# Patient Record
Sex: Male | Born: 1935 | Race: White | Hispanic: No | Marital: Married | Smoking: Never smoker
Health system: Southern US, Community
[De-identification: ages and names within clinical notes are randomized; demographics above are authoritative.]

## PROBLEM LIST (undated history)

## (undated) DIAGNOSIS — N200 Calculus of kidney: Secondary | ICD-10-CM

## (undated) DIAGNOSIS — G4730 Sleep apnea, unspecified: Secondary | ICD-10-CM

## (undated) DIAGNOSIS — I2510 Atherosclerotic heart disease of native coronary artery without angina pectoris: Secondary | ICD-10-CM

## (undated) DIAGNOSIS — Z87442 Personal history of urinary calculi: Secondary | ICD-10-CM

## (undated) DIAGNOSIS — C801 Malignant (primary) neoplasm, unspecified: Secondary | ICD-10-CM

## (undated) DIAGNOSIS — E785 Hyperlipidemia, unspecified: Secondary | ICD-10-CM

## (undated) DIAGNOSIS — R0602 Shortness of breath: Secondary | ICD-10-CM

## (undated) DIAGNOSIS — J189 Pneumonia, unspecified organism: Secondary | ICD-10-CM

## (undated) DIAGNOSIS — M1990 Unspecified osteoarthritis, unspecified site: Secondary | ICD-10-CM

## (undated) DIAGNOSIS — R51 Headache: Secondary | ICD-10-CM

## (undated) DIAGNOSIS — K219 Gastro-esophageal reflux disease without esophagitis: Secondary | ICD-10-CM

## (undated) DIAGNOSIS — I499 Cardiac arrhythmia, unspecified: Secondary | ICD-10-CM

## (undated) DIAGNOSIS — I10 Essential (primary) hypertension: Secondary | ICD-10-CM

## (undated) HISTORY — PX: EYE SURGERY: SHX253

## (undated) HISTORY — PX: CARDIAC CATHETERIZATION: SHX172

## (undated) HISTORY — PX: HERNIA REPAIR: SHX51

---

## 2009-02-06 HISTORY — PX: CORONARY ANGIOPLASTY: SHX604

## 2012-07-26 ENCOUNTER — Encounter (HOSPITAL_COMMUNITY): Payer: Self-pay | Admitting: *Deleted

## 2012-07-26 ENCOUNTER — Other Ambulatory Visit: Payer: Self-pay | Admitting: Urology

## 2012-07-26 ENCOUNTER — Encounter (HOSPITAL_COMMUNITY): Payer: Self-pay | Admitting: Pharmacy Technician

## 2012-07-26 NOTE — H&P (Signed)
History of Present Illness  Nicolas Bush is a 78 year old with the following urologic history:  1) Prostate cancer: He was initially diagnosed in January 2009 and elected to be managed with active surveillance.  Initial diagnosis: February 28, 2007 TNM stage: cT1c Nx Mx Gleason score: 3+3=6 PSA at diagnosis: 18.10 Feb 2007: Initial biopsy- 1/14 cores involved -- 1 core on left side , < 1% of overall tissue, volume 129 cc May 2009: Biopsy - 22 cores, no malignancy, volume 171.7 cc June 2010: Biopsy - 20 cores, no malignancy, volume 215.29 cc Oct 2012: Biopsy - 12 cores, no malignancy, HGPIN, volume 221.0 cc  2) BPH/LUTS: His prostate volume has been estimated to be > 200 cc. Baseline symptoms include frequency, urgency, urge incontinence, incomplete emptying, intermittency, and a weak stream.  Baseline IPSS: 18/3 Current treatment: Finasteride (began 5 alpha reductase therapy in 2008), Doxazosin 4 mg  3) Erectile dysfunction: He does have significant erectile dysfunction.  This is not of concern to the patient.  4) Nephrolithiasis:  5) Hematuria: He was noted to have persistent microscopic hematuria in May 2014. He has denied gross hematuria.  He has no history of smoking. There is no family history of bladder or kidney cancer. He was found to have renal calculi as the most likely cause for his hematuria. May 2014: Cystoscopy (negative except for BPH), CT imaging (renal calculi)  ** Medical comorbidities: He had 3 cardiac stents placed in 2011 and is managed with aspirin.   Interval history:  He follows up today with a complaint of right-sided flank pain with radiation into his right lower quadrant.  His pain has been intermittently severe although currently is tolerable.  He has taken some intermittent narcotic pain medication as prescribed by his primary care physician.  He has not noted any gross hematuria nor fever.  He has had nausea and vomiting.     Past Medical History Problems   1. History of  Arthritis V13.4 2. History of  Cath Stent 1 Type Drug-Eluting 3. History of  Coronary Artery Disease V12.59 4. History of  Esophageal Reflux 530.81 5. History of  Hypothyroidism 244.9 6. Nephrolithiasis 592.0 7. Prostate Cancer 185  Surgical History Problems  1. History of  Inguinal Hernia Repair Bilateral  Current Meds 1. AmLODIPine Besylate 5 MG Oral Tablet; Therapy: 21Nov2012 to 2. CVS Aspirin Adult Low Strength 81 MG Oral Tablet Delayed Release; Therapy:  (Recorded:24Jul2012) to 3. Doxazosin Mesylate 4 MG Oral Tablet; Take 1 tablet daily; Therapy: 27May2009 to  (Evaluate:05Sep2015)  Requested for: 13Jun2014; Last Rx:12Jun2014 4. Finasteride 5 MG Oral Tablet; TAKE 1 TABLET EVERY DAY AS DIRECTED; Therapy: 11Jan2010 to  (Evaluate:05Sep2015)  Requested for: 13Jun2014; Last Rx:12Jun2014 5. Isosorbide Mononitrate ER 30 MG Oral Tablet Extended Release 24 Hour; Therapy: 17Apr2013 to 6. Metoprolol Tartrate 50 MG Oral Tablet; Therapy: 15Jan2014 to 7. Nitrostat 0.4 MG Sublingual Tablet Sublingual; Therapy: 17Apr2013 to 8. Polymyxin B-Trimethoprim 10000-0.1 UNIT/ML-% Ophthalmic Solution; Therapy: 07May2014 to 9. PrednisoLONE Acetate 1 % Ophthalmic Suspension; Therapy: 07May2014 to 10. Ranitidine HCl 300 MG Oral Tablet; Therapy: 17Apr2013 to  Allergies Medication  1. No Known Drug Allergies  Family History Problems  1. Sororal history of  Colon Cancer V16.0 2. Paternal history of  Heart Disease V17.49 3. Maternal history of  Lung Cancer V16.1 4. Fraternal history of  Nephrolithiasis 5. Daughter's history of  Ovarian Cancer V16.41 6. Fraternal history of  Prostate Cancer V16.42 7. Fraternal history of  Prostate Cancer V16.42 8. Fraternal  history of  Prostate Cancer V16.42 9. Fraternal history of  Tuberculosis  Social History Problems  1. Marital History - Currently Married 2. Never A Smoker Denied  3. Alcohol Use 4. Tobacco Use  Review of  Systems  Genitourinary: no hematuria.  Gastrointestinal: nausea and vomiting.  Constitutional: no fever.    Vitals Vital Signs [Data Includes: Last 1 Day]  20Jun2014 08:40AM  BMI Calculated: 27.77 BSA Calculated: 2.15 Height: 6 ft  Weight: 205 lb  Blood Pressure: 121 / 74 Heart Rate: 76  Physical Exam Constitutional: Well nourished and well developed . No acute distress.  ENT:. The ears and nose are normal in appearance.  Neck: The appearance of the neck is normal and no neck mass is present.  Pulmonary: No respiratory distress, normal respiratory rhythm and effort and clear bilateral breath sounds.  Cardiovascular: Heart rate and rhythm are normal . No peripheral edema.  Abdomen: The abdomen is soft and nontender. No masses are palpated. mild right CVA tenderness. No hernias are palpable. No hepatosplenomegaly noted.  Skin: Normal skin turgor, no visible rash and no visible skin lesions.  Neuro/Psych:. Mood and affect are appropriate.    Results/Data Urine [Data Includes: Last 1 Day]   20Jun2014  COLOR YELLOW   APPEARANCE CLEAR   SPECIFIC GRAVITY 1.025   pH 6.0   GLUCOSE NEG mg/dL  BILIRUBIN NEG   KETONE NEG mg/dL  BLOOD TRACE   PROTEIN 30 mg/dL  UROBILINOGEN 1 mg/dL  NITRITE NEG   LEUKOCYTE ESTERASE NEG   SQUAMOUS EPITHELIAL/HPF NONE SEEN   WBC 0-2 WBC/hpf  RBC 0-2 RBC/hpf  BACTERIA RARE   CRYSTALS NONE SEEN   CASTS NONE SEEN   Selected Results  BASIC METABOLIC PANEL 20Jun2014 09:43AM Heloise Purpura  SPECIMEN TYPE: BLOOD   Test Name Result Flag Reference  GLUCOSE 120 mg/dL H 78-29  BUN 18 mg/dL  5-62  CREATININE 1.30 mg/dL H 8.65-7.84  SODIUM 696 mEq/L  135-145  POTASSIUM 4.2 mEq/L  3.5-5.3  CHLORIDE 103 mEq/L  96-112  CO2 24 mEq/L  19-32  CALCIUM 8.7 mg/dL  2.9-52.8  Est GFR, African American 51 mL/min L   Est GFR, NonAfrican American 45 mL/min L   THE ESTIMATED GFR IS A CALCULATION VALID FOR ADULTS (>=90 YEARS OLD) THAT USES THE CKD-EPI ALGORITHM TO  ADJUST FOR AGE AND SEX. IT IS   NOT TO BE USED FOR CHILDREN, PREGNANT WOMEN, HOSPITALIZED PATIENTS,    PATIENTS ON DIALYSIS, OR WITH RAPIDLY CHANGING KIDNEY FUNCTION. ACCORDING TO THE NKDEP, EGFR >89 IS NORMAL, 60-89 SHOWS MILD IMPAIRMENT, 30-59 SHOWS MODERATE IMPAIRMENT, 15-29 SHOWS SEVERE IMPAIRMENT AND <15 IS ESRD.   AU CT-HEMATURIA PROTOCOL 20May2014 12:00AM Heloise Purpura   Test Name Result Flag Reference  ** RADIOLOGY REPORT BY [facility] **   *RADIOLOGY REPORT*  Clinical Data: Microhematuria, prostate cancer diagnosed 2009, BPH, history of renal stones  CT ABDOMEN AND PELVIS WITHOUT AND WITH CONTRAST  Technique: Multidetector CT imaging of the abdomen and pelvis was performed without contrast material in one or both body regions, followed by contrast material(s) and further sections in one or both body regions.  Contrast: 125 ml Isovue 300 IV  Comparison: None.  Findings: Motion degraded images.  Lung bases are essentially clear.  Small hiatal hernia.  Liver, spleen, pancreas, and adrenal glands are within normal limits.  Multiple layering gallstones (series 2/image 30). No associated inflammatory changes.  14 x 12 mm left posterior interpolar cyst (series 6/image 47). No enhancing renal lesions.  Two nonobstructing calculi in the right upper pole measuring up to 6 mm (series 2/image 38). 7 x 3 mm nonobstructing calculus in the right lower pole (series 2/image 43). Cluster of nonobstructing calculi in the right renal pelvis, measuring 10 x 13 mm in aggregate (series 2/image 41). Largest individual pelvic calculus measures 9 mm (series 2/image 40). No hydronephrosis.  No evidence of bowel obstruction. Small distal duodenal diverticulum (series 2/image 33). Normal appendix. Extensive colonic diverticulosis, without associated inflammatory changes.  Atherosclerotic calcifications of the abdominal aorta and branch vessels.  No abdominopelvic  ascites.  Tiny retroperitoneal/pelvic lymph nodes measuring 5-6 mm short axis, which does not meet pathologic CT size criteria. Prostatomegaly, measuring 8.1 cm in transverse dimension, with enlargement of the central gland which indents the base of the bladder.  No distal ureteral or bladder calculi. Bladder is thick-walled, likely reflecting chronic outlet obstruction.  On delayed imaging, there are no filling defects in the bilateral opacified proximal collecting systems, ureters, or bladder.  Mild degenerative changes of the visualized thoracolumbar spine.  IMPRESSION: Multiple nonobstructing right renal calculi with additional calculi in the right proximal collecting system, as described above. No distal ureteral or bladder calculi. No hydronephrosis.  14 mm posterior interpolar left renal cyst. No enhancing renal lesions.  Prostatomegaly, measuring 8.1 cm in transverse dimension, with enlargement of the central gland.  No evidence metastatic disease in the abdomen/pelvis. Small retroperitoneal/pelvic lymph nodes measuring 5-6 mm short axis, which does not meet pathologic CT size criteria.  Thick-walled bladder, likely reflecting sequela of chronic bladder outlet obstruction.   Original Report Authenticated By: Charline Bills, M.D.     I again and apparently reviewed his CT scan from May today which demonstrated multiple renal calculi including a cluster of stones at the ureteropelvic junction.  His KUB x-ray today confirms the stones and there is the possibility of a distal ureteral stone on the right measuring approximately 5-6 mm.  Assessment Assessed  1. Nephrolithiasis 592.0  Plan Health Maintenance (V70.0)  1. UA With REFLEX  Done: 20Jun2014 08:34AM Nephrolithiasis (592.0)  2. VENIPUNCTURE  Done: 20Jun2014 3. Follow-up Schedule Surgery Office  Follow-up  Done: 20Jun2014  Discussion/Summary  1.  Right flank pain secondary to urolithiasis: I did recommend  proceeding with intervention at this point.  He was provided a prescription for pain medication and antinausea medication.  We discussed options which would include percutaneous nephrolithotomy versus shockwave lithotripsy versus ureteroscopic laser lithotripsy.  Considering his history of a cardiac stent and the need to remain on antiplatelet therapy, we discussed proceeding with ureteroscopic laser lithotripsy so that he may remain on his aspirin perioperatively.  He currently takes aspirin 81 mg.  He is no longer taking Plavix.  He is agreeable to this approach and we reviewed the potential risks, complications, and alternative options as well as the expected recovery process associated with this procedure.  He agrees to proceed and gives his informed consent.  2.  Prostate cancer: He will keep his scheduled appointment for a PSA and DRE in a few months.  3.  BPH/LUTS: He will continue current therapy with finasteride and doxazosin and will have a PVR/IPSS questionnaire performed in the next few months.  Cc: Dr. Lorelei Pont     Signatures Electronically signed by : Heloise Purpura, M.D.; Jul 26 2012  6:55PM

## 2012-07-26 NOTE — Progress Notes (Signed)
Patient to have second cataract surgery on right eye on 08/13/12.

## 2012-07-26 NOTE — Progress Notes (Signed)
Need orders in EPIC.  Surgery scheduled for 07/29/12.  Thank You

## 2012-07-26 NOTE — Progress Notes (Signed)
Spoke with daughter over phone- Beecher Furio- [phone] or [phone] to obtain medications and medical history.  Daughter aware patient npo after midnite.  May have clear liquids until 1100am then npo on 07/29/12.  Reviewed clear liquid diet with daughter .  Daughter voiced understanding.  Patient has history of 3 coronary stents.  Last office visit with cardiologist- Dr Keith Rake in [place].  Requested information of last office visit,  ekg , stress test and echo from cardiogist.  They are to fax.  Dr Catha Gosselin phone [phone] and fax number is [phone].  Spoke with [facility] in [place] , [place]- at [address] at [phone] to obtain clarification on medications.

## 2012-07-26 NOTE — Progress Notes (Signed)
Last office visit note of 05/21/12 on chart along with most recent EKG- 02/15/2010 on chart

## 2012-07-29 ENCOUNTER — Ambulatory Visit (HOSPITAL_COMMUNITY): Payer: Medicare Other

## 2012-07-29 ENCOUNTER — Ambulatory Visit (HOSPITAL_COMMUNITY): Payer: Medicare Other | Admitting: Anesthesiology

## 2012-07-29 ENCOUNTER — Encounter (HOSPITAL_COMMUNITY): Payer: Self-pay | Admitting: *Deleted

## 2012-07-29 ENCOUNTER — Encounter (HOSPITAL_COMMUNITY): Payer: Self-pay | Admitting: Anesthesiology

## 2012-07-29 ENCOUNTER — Encounter (HOSPITAL_COMMUNITY)
Admission: RE | Disposition: A | Discharge status: Home / Self Care | Payer: Self-pay | Source: Ambulatory Visit | Attending: Urology

## 2012-07-29 ENCOUNTER — Ambulatory Visit (HOSPITAL_COMMUNITY)
Admission: RE | Admit: 2012-07-29 | Discharge: 2012-07-29 | Disposition: A | Discharge status: Home / Self Care | Payer: Medicare Other | Source: Ambulatory Visit | Attending: Urology | Admitting: Urology

## 2012-07-29 DIAGNOSIS — K219 Gastro-esophageal reflux disease without esophagitis: Secondary | ICD-10-CM | POA: Insufficient documentation

## 2012-07-29 DIAGNOSIS — C61 Malignant neoplasm of prostate: Secondary | ICD-10-CM | POA: Insufficient documentation

## 2012-07-29 DIAGNOSIS — N529 Male erectile dysfunction, unspecified: Secondary | ICD-10-CM | POA: Insufficient documentation

## 2012-07-29 DIAGNOSIS — F17200 Nicotine dependence, unspecified, uncomplicated: Secondary | ICD-10-CM | POA: Insufficient documentation

## 2012-07-29 DIAGNOSIS — N401 Enlarged prostate with lower urinary tract symptoms: Secondary | ICD-10-CM | POA: Insufficient documentation

## 2012-07-29 DIAGNOSIS — I10 Essential (primary) hypertension: Secondary | ICD-10-CM | POA: Insufficient documentation

## 2012-07-29 DIAGNOSIS — Z79899 Other long term (current) drug therapy: Secondary | ICD-10-CM | POA: Insufficient documentation

## 2012-07-29 DIAGNOSIS — E039 Hypothyroidism, unspecified: Secondary | ICD-10-CM | POA: Insufficient documentation

## 2012-07-29 DIAGNOSIS — I2510 Atherosclerotic heart disease of native coronary artery without angina pectoris: Secondary | ICD-10-CM | POA: Insufficient documentation

## 2012-07-29 DIAGNOSIS — N200 Calculus of kidney: Secondary | ICD-10-CM | POA: Insufficient documentation

## 2012-07-29 DIAGNOSIS — N138 Other obstructive and reflux uropathy: Secondary | ICD-10-CM | POA: Insufficient documentation

## 2012-07-29 HISTORY — PX: HOLMIUM LASER APPLICATION: SHX5852

## 2012-07-29 HISTORY — DX: Calculus of kidney: N20.0

## 2012-07-29 HISTORY — DX: Malignant (primary) neoplasm, unspecified: C80.1

## 2012-07-29 HISTORY — DX: Essential (primary) hypertension: I10

## 2012-07-29 HISTORY — DX: Headache: R51

## 2012-07-29 HISTORY — DX: Cardiac arrhythmia, unspecified: I49.9

## 2012-07-29 HISTORY — DX: Gastro-esophageal reflux disease without esophagitis: K21.9

## 2012-07-29 HISTORY — PX: CYSTOSCOPY WITH RETROGRADE PYELOGRAM, URETEROSCOPY AND STENT PLACEMENT: SHX5789

## 2012-07-29 HISTORY — DX: Unspecified osteoarthritis, unspecified site: M19.90

## 2012-07-29 HISTORY — DX: Shortness of breath: R06.02

## 2012-07-29 HISTORY — DX: Atherosclerotic heart disease of native coronary artery without angina pectoris: I25.10

## 2012-07-29 LAB — CBC
MCH: 30.2 pg (ref 26.0–34.0)
MCV: 88.1 fL (ref 78.0–100.0)
Platelets: 250 10*3/uL (ref 150–400)
RBC: 4.61 MIL/uL (ref 4.22–5.81)
RDW: 13.1 % (ref 11.5–15.5)

## 2012-07-29 LAB — SURGICAL PCR SCREEN: MRSA, PCR: NEGATIVE

## 2012-07-29 LAB — BASIC METABOLIC PANEL
Chloride: 102 mEq/L (ref 96–112)
Creatinine, Ser: 1.34 mg/dL (ref 0.50–1.35)
GFR calc Af Amer: 58 mL/min — ABNORMAL LOW (ref >90)
GFR calc non Af Amer: 50 mL/min — ABNORMAL LOW (ref >90)
Potassium: 3.9 mEq/L (ref 3.5–5.1)

## 2012-07-29 SURGERY — CYSTOURETEROSCOPY, WITH RETROGRADE PYELOGRAM AND STENT INSERTION
Anesthesia: General | Site: Ureter | Laterality: Right | Wound class: Clean Contaminated

## 2012-07-29 MED ORDER — LACTATED RINGERS IV SOLN
INTRAVENOUS | Status: DC | PRN
  Administered 2012-07-29 (×2): via INTRAVENOUS

## 2012-07-29 MED ORDER — MUPIROCIN 2 % EX OINT
TOPICAL_OINTMENT | Freq: Two times a day (BID) | CUTANEOUS | Status: DC
  Administered 2012-07-29: 1 via NASAL
  Filled 2012-07-29: qty 22

## 2012-07-29 MED ORDER — CIPROFLOXACIN IN D5W 400 MG/200ML IV SOLN
INTRAVENOUS | Status: AC
  Filled 2012-07-29: qty 200

## 2012-07-29 MED ORDER — PROPOFOL 10 MG/ML IV BOLUS
INTRAVENOUS | Status: DC | PRN
  Administered 2012-07-29: 140 mg via INTRAVENOUS

## 2012-07-29 MED ORDER — ONDANSETRON HCL 4 MG/2ML IJ SOLN
INTRAMUSCULAR | Status: DC | PRN
  Administered 2012-07-29: 4 mg via INTRAVENOUS

## 2012-07-29 MED ORDER — IOHEXOL 300 MG/ML  SOLN
INTRAMUSCULAR | Status: AC
  Filled 2012-07-29: qty 1

## 2012-07-29 MED ORDER — CIPROFLOXACIN HCL 500 MG PO TABS: 500.0000 mg | ORAL_TABLET | Freq: Two times a day (BID) | ORAL | Status: DC

## 2012-07-29 MED ORDER — INDIGOTINDISULFONATE SODIUM 8 MG/ML IJ SOLN
INTRAMUSCULAR | Status: DC | PRN
  Administered 2012-07-29: 5 mL via INTRAVENOUS

## 2012-07-29 MED ORDER — BELLADONNA ALKALOIDS-OPIUM 16.2-60 MG RE SUPP
RECTAL | Status: AC
  Filled 2012-07-29: qty 1

## 2012-07-29 MED ORDER — CIPROFLOXACIN IN D5W 400 MG/200ML IV SOLN
400.0000 mg | INTRAVENOUS | Status: AC
  Administered 2012-07-29: 400 mg via INTRAVENOUS

## 2012-07-29 MED ORDER — FENTANYL CITRATE 0.05 MG/ML IJ SOLN
25.0000 ug | INTRAMUSCULAR | Status: DC | PRN
  Administered 2012-07-29: 25 ug via INTRAVENOUS
  Administered 2012-07-29: 50 ug via INTRAVENOUS

## 2012-07-29 MED ORDER — LIDOCAINE HCL 2 % EX GEL
CUTANEOUS | Status: AC
  Filled 2012-07-29: qty 10

## 2012-07-29 MED ORDER — PROMETHAZINE HCL 25 MG/ML IJ SOLN: 6.2500 mg | INTRAMUSCULAR | Status: DC | PRN

## 2012-07-29 MED ORDER — MEPERIDINE HCL 50 MG/ML IJ SOLN: 6.2500 mg | INTRAMUSCULAR | Status: DC | PRN

## 2012-07-29 MED ORDER — FENTANYL CITRATE 0.05 MG/ML IJ SOLN
INTRAMUSCULAR | Status: AC
  Administered 2012-07-29: 50 ug
  Filled 2012-07-29: qty 2

## 2012-07-29 MED ORDER — PHENAZOPYRIDINE HCL 100 MG PO TABS: 100.0000 mg | ORAL_TABLET | Freq: Three times a day (TID) | ORAL | Status: DC | PRN

## 2012-07-29 MED ORDER — IOHEXOL 300 MG/ML  SOLN
INTRAMUSCULAR | Status: DC | PRN
  Administered 2012-07-29: 8 mL via INTRAVENOUS

## 2012-07-29 MED ORDER — FENTANYL CITRATE 0.05 MG/ML IJ SOLN
INTRAMUSCULAR | Status: DC | PRN
  Administered 2012-07-29 (×2): 25 ug via INTRAVENOUS
  Administered 2012-07-29: 50 ug via INTRAVENOUS
  Administered 2012-07-29 (×2): 25 ug via INTRAVENOUS

## 2012-07-29 MED ORDER — EPHEDRINE SULFATE 50 MG/ML IJ SOLN
INTRAMUSCULAR | Status: DC | PRN
  Administered 2012-07-29: 10 mg via INTRAVENOUS

## 2012-07-29 SURGICAL SUPPLY — 21 items
ADAPTER CATH URET PLST 4-6FR (CATHETERS) ×2 IMPLANT
BAG URO CATCHER STRL LF (DRAPE) ×2 IMPLANT
BASKET ZERO TIP NITINOL 2.4FR (BASKET) ×2 IMPLANT
CATH INTERMIT  6FR 70CM (CATHETERS) ×2 IMPLANT
CLOTH BEACON ORANGE TIMEOUT ST (SAFETY) ×2 IMPLANT
DRAPE CAMERA CLOSED 9X96 (DRAPES) ×2 IMPLANT
GLOVE BIOGEL M STRL SZ7.5 (GLOVE) ×2 IMPLANT
GOWN PREVENTION PLUS XLARGE (GOWN DISPOSABLE) ×2 IMPLANT
GOWN STRL NON-REIN LRG LVL3 (GOWN DISPOSABLE) ×4 IMPLANT
GUIDEWIRE ANG ZIPWIRE 038X150 (WIRE) ×2 IMPLANT
GUIDEWIRE STR DUAL SENSOR (WIRE) ×2 IMPLANT
IV NS 1000ML (IV SOLUTION) ×1
IV NS 1000ML BAXH (IV SOLUTION) ×1 IMPLANT
IV NS IRRIG 3000ML ARTHROMATIC (IV SOLUTION) ×2 IMPLANT
LASER FIBER DISP (UROLOGICAL SUPPLIES) ×2 IMPLANT
MANIFOLD NEPTUNE II (INSTRUMENTS) ×2 IMPLANT
NS IRRIG 1000ML POUR BTL (IV SOLUTION) ×2 IMPLANT
PACK CYSTO (CUSTOM PROCEDURE TRAY) ×2 IMPLANT
SHEATH ACCESS URETERAL 38CM (SHEATH) ×2 IMPLANT
STENT CONTOUR 6FRX26X.038 (STENTS) ×2 IMPLANT
TUBING CONNECTING 10 (TUBING) ×2 IMPLANT

## 2012-07-29 NOTE — Anesthesia Postprocedure Evaluation (Signed)
  Anesthesia Post-op Note  Patient: Nicolas Bush  Procedure(s) Performed: Procedure(s) (LRB): CYSTOSCOPY WITH RETROGRADE PYELOGRAM, URETEROSCOPY AND STENT PLACEMENT, LASER WITH STONE EXTRACTION (Right) HOLMIUM LASER APPLICATION (Right)  Patient Location: PACU  Anesthesia Type: General  Level of Consciousness: awake and alert   Airway and Oxygen Therapy: Patient Spontanous Breathing  Post-op Pain: mild  Post-op Assessment: Post-op Vital signs reviewed, Patient's Cardiovascular Status Stable, Respiratory Function Stable, Patent Airway and No signs of Nausea or vomiting  Last Vitals:  Filed Vitals:   07/29/12 1945  BP: 136/68  Pulse: 80  Temp:   Resp: 15    Post-op Vital Signs: stable   Complications: No apparent anesthesia complications

## 2012-07-29 NOTE — Interval H&P Note (Signed)
History and Physical Interval Note:  07/29/2012 4:53 PM  Nicolas Bush  has presented today for surgery, with the diagnosis of RIGHT RENAL CALCULI  The various methods of treatment have been discussed with the patient and family. After consideration of risks, benefits and other options for treatment, the patient has consented to  Procedure(s): CYSTOSCOPY WITH RETROGRADE PYELOGRAM, URETEROSCOPY AND STENT PLACEMENT (Right) HOLMIUM LASER APPLICATION (Right) as a surgical intervention .  The patient's history has been reviewed, patient examined, no change in status, stable for surgery.  I have reviewed the patient's chart and labs.  Questions were answered to the patient's satisfaction.     Yacob Wilkerson,LES

## 2012-07-29 NOTE — Transfer of Care (Signed)
Immediate Anesthesia Transfer of Care Note  Patient: Nicolas Bush  Procedure(s) Performed: Procedure(s) with comments: CYSTOSCOPY WITH RETROGRADE PYELOGRAM, URETEROSCOPY AND STENT PLACEMENT, LASER WITH STONE EXTRACTION (Right) - right ureter HOLMIUM LASER APPLICATION (Right) - right ureter  Patient Location: PACU  Anesthesia Type:General  Level of Consciousness: awake, alert , oriented and patient cooperative  Airway & Oxygen Therapy: Patient Spontanous Breathing and Patient connected to face mask oxygen  Post-op Assessment: Report given to PACU RN and Post -op Vital signs reviewed and stable  Post vital signs: Reviewed and stable  Complications: No apparent anesthesia complications

## 2012-07-29 NOTE — Op Note (Signed)
Preoperative diagnosis: Right renal calculi  Postoperative diagnosis: Right renal calculi  Procedure:  1. Cystoscopy 2. Right ureteroscopy and stone removal 3. Ureteroscopic laser lithotripsy 4. Right ureteral stent placement (6 x 26) 5. Right retrograde pyelography with interpretation  Surgeon: Moody Bruins. M.D.  Anesthesia: General  Complications: None  Intraoperative findings: Right retrograde pyelography demonstrated a filling defect within the right renal pelvis consistent with the patient's known calculus without other abnormalities noted.  EBL: Minimal  Specimens: 1. Right renal calculi  Disposition of specimens: [facility] for stone analysis  Indication: Nicolas Bush  is a 78 y.o. patient with symptomatic urolithiasis. After reviewing the management options for treatment, they elected to proceed with the above surgical procedure(s). We have discussed the potential benefits and risks of the procedure, side effects of the proposed treatment, the likelihood of the patient achieving the goals of the procedure, and any potential problems that might occur during the procedure or recuperation. Informed consent has been obtained.  Description of procedure:  The patient was taken to the operating room and general anesthesia was induced.  The patient was placed in the dorsal lithotomy position, prepped and draped in the usual sterile fashion, and preoperative antibiotics were administered. A preoperative time-out was performed.   Cystourethroscopy was performed.  The patient's urethra was examined and was normal except for an extremely enlarged prostate with a very large median lobe. The bladder was then systematically examined in its entirety. There was no evidence for any bladder tumors, stones, or other mucosal pathology.    Attention then turned to the right ureteral orifice and a ureteral catheter was used to intubate the ureteral orifice.   Omnipaque contrast was injected through the ureteral catheter and a retrograde pyelogram was performed with findings as dictated above.  This demonstrated a J-hooking ureter and it was very difficult to find the ureteral orifice let alone to canulate it for the retrograde study.  A 0.38 sensor guidewire was then attempted to be inserted into the right ureter.  It curled in the distal ureter and could not be advanced. At attempt with a glidewire also failed. Finally, the 6 Fr semirigid ureteroscope was inserted into the bladder and the lumen of the ureter was able to be identified. The sensor wire was able to be advanced eventually and the ureteroscope followed the wire into the right ureter. The ureter was visualized up to the proximal ureter with no stones seen. The ureteroscope was then removed with the wire left in place and having been advanced up the right ureter into the renal pelvis under fluoroscopic guidance.  A 12/14 Fr ureteral access sheath was then advanced over the guide wire. The digital flexible ureteroscope was then advanced through the access sheath into the ureter next to the guidewire and the calculi were identified in an upper pole calyx.   There were multiple large stone fragments and the stones were then fragmented with the 200 micron holmium laser fiber on a setting of 0.5 J and frequency of 20 Hz.   All sizable stones were then removed with a zero tip nitinol basket.  Reinspection of the ureter/renal pelvis revealed no remaining visible stones or fragments of significant size.   The safety wire was then replaced and the access sheath removed.  The guidewire was backloaded through the cystoscope and a ureteral stent was advance over the wire using Seldinger technique.  The stent was positioned appropriately under fluoroscopic and cystoscopic guidance.  The wire was  then removed with an adequate stent curl noted in the renal pelvis as well as in the bladder.  Due to the significant  ureteral edema associated with difficult access into the ureter, the stent was left without a string.  The bladder was then emptied and the procedure ended.  The patient appeared to tolerate the procedure well and without complications.  The patient was able to be awakened and transferred to the recovery unit in satisfactory condition.   Moody Bruins MD

## 2012-07-29 NOTE — Anesthesia Preprocedure Evaluation (Signed)
Anesthesia Evaluation  Patient identified by MRN, date of birth, ID band Patient awake    Reviewed: Allergy & Precautions, H&P , NPO status , Patient's Chart, lab work & pertinent test results  Airway Mallampati: II TM Distance: >3 FB Neck ROM: Full    Dental no notable dental hx.    Pulmonary neg pulmonary ROS, shortness of breath and with exertion,  breath sounds clear to auscultation  Pulmonary exam normal       Cardiovascular hypertension, Pt. on medications + CAD and + Cardiac Stents (2011) - dysrhythmias Rhythm:Regular Rate:Normal     Neuro/Psych negative neurological ROS  negative psych ROS   GI/Hepatic negative GI ROS, Neg liver ROS, GERD-  Medicated and Controlled,  Endo/Other  negative endocrine ROSHypothyroidism   Renal/GU negative Renal ROS  negative genitourinary   Musculoskeletal negative musculoskeletal ROS (+)   Abdominal   Peds negative pediatric ROS (+)  Hematology negative hematology ROS (+)   Anesthesia Other Findings Upper front cap  Reproductive/Obstetrics negative OB ROS                           Anesthesia Physical Anesthesia Plan  ASA: III  Anesthesia Plan: General   Post-op Pain Management:    Induction: Intravenous  Airway Management Planned: LMA  Additional Equipment:   Intra-op Plan:   Post-operative Plan: Extubation in OR  Informed Consent: I have reviewed the patients History and Physical, chart, labs and discussed the procedure including the risks, benefits and alternatives for the proposed anesthesia with the patient or authorized representative who has indicated his/her understanding and acceptance.   Dental advisory given  Plan Discussed with: CRNA  Anesthesia Plan Comments:         Anesthesia Quick Evaluation

## 2012-07-29 NOTE — Preoperative (Signed)
Beta Blockers   Reason not to administer Beta Blockers:Not Applicable 

## 2012-07-30 ENCOUNTER — Encounter (HOSPITAL_COMMUNITY): Payer: Self-pay | Admitting: Urology

## 2013-06-20 ENCOUNTER — Other Ambulatory Visit: Payer: Self-pay | Admitting: Urology

## 2013-07-15 ENCOUNTER — Encounter (HOSPITAL_COMMUNITY): Payer: Self-pay | Admitting: *Deleted

## 2013-07-16 ENCOUNTER — Encounter (HOSPITAL_COMMUNITY): Payer: Self-pay | Admitting: Pharmacy Technician

## 2013-07-16 ENCOUNTER — Encounter (HOSPITAL_COMMUNITY): Payer: Self-pay | Admitting: *Deleted

## 2013-07-25 NOTE — H&P (Signed)
History of Present Illness Nicolas Bush is a 79 year old with the following urologic history:  1) Prostate cancer: He was initially diagnosed in January 2009 and elected to be managed with active surveillance.  Initial diagnosis: February 28, 2007 TNM stage: cT1c Nx Mx Gleason score: 3+3=6 PSA at diagnosis: 18.10 Feb 2007: Initial biopsy- 1/14 cores involved -- 1 core on left side , < 1% of overall tissue, volume 129 cc May 2009: Biopsy - 22 cores, no malignancy, volume 171.7 cc June 2010: Biopsy - 20 cores, no malignancy, volume 215.29 cc Oct 2012: Biopsy - 12 cores, no malignancy, HGPIN, volume 221.0 cc   2) BPH/LUTS: His prostate volume has been estimated to be > 200 cc. Baseline symptoms include frequency, urgency, urge incontinence, incomplete emptying, intermittency, and a weak stream. Baseline IPSS: 18/3  Current treatment: Finasteride (began 5 alpha reductase therapy in 2008), Doxazosin 4 mg   3) Erectile dysfunction: He does have significant erectile dysfunction. This is not of concern to the patient.  4) Nephrolithiasis: He developed symptomatic right renal calculi in May 2014.  Jun 2014: R ureteroscopic laser lithotripsy   5) Hematuria: He was noted to have persistent microscopic hematuria in May 2014. He has denied gross hematuria. He has no history of smoking. There is no family history of bladder or kidney cancer. He was found to have renal calculi as the most likely cause for his hematuria.  May 2014: Cystoscopy (negative except for BPH), CT imaging (renal calculi)   ** Medical comorbidities: He had 3 cardiac stents placed in 2011 and is managed with aspirin.    He was recently found to have recurrent right renal pelvic stone growth and bladder calculi which are mildly symptomatic.    Past Medical History Problems  1. History of Arthritis (V13.4) 2. History of Coronary Artery Disease (V12.59) 3. History of esophageal reflux (V12.79) 4. History of  hypothyroidism (V12.29) 5. Nephrolithiasis (592.0) 6. Prostate cancer (185)  Surgical History Problems  1. History of Cath Stent 1 Type Drug-Eluting 2. History of Cystoscopy With Insertion Of Ureteral Stent Right 3. History of Cystoscopy With Ureteroscopy With Lithotripsy 4. History of Inguinal Hernia Repair  Current Meds 1. AmLODIPine Besylate 5 MG Oral Tablet;  Therapy: 21Nov2012 to Recorded 2. CVS Aspirin Adult Low Strength 81 MG Oral Tablet Delayed Release;  Therapy: (Recorded:24Jul2012) to Recorded 3. Doxazosin Mesylate 4 MG Oral Tablet; Take 1 tablet daily;  Therapy: [id number] to (Evaluate:05Sep2015)  Requested for: [phone]; Last  Rx:12Jun2014 Ordered 4. Finasteride 5 MG Oral Tablet; TAKE 1 TABLET EVERY DAY AS DIRECTED;  Therapy: [id number] to (Evaluate:05Sep2015)  Requested for: [phone]; Last  Rx:12Jun2014 Ordered 5. Isosorbide Mononitrate ER 30 MG Oral Tablet Extended Release 24 Hour;  Therapy: 17Apr2013 to Recorded 6. Metoprolol Tartrate 50 MG Oral Tablet;  Therapy: [id number] to Recorded 7. Nitrostat 0.4 MG Sublingual Tablet Sublingual;  Therapy: [id number] to Recorded 8. Plavix TABS;  Therapy: (Recorded:16Sep2014) to Recorded 9. Ranitidine HCl - 300 MG Oral Tablet;  Therapy: 17Apr2013 to Recorded  Allergies Medication  1. Codeine Derivatives  Family History Problems  1. Family history of Colon Cancer (V16.0) : Sister 2. Family history of Heart Disease (V17.49) : Father 3. Family history of Lung Cancer (V16.1) : Mother 4. Family history of Nephrolithiasis : Brother 5. Family history of Ovarian Cancer (V16.41) : Daughter 24. Family history of Prostate Cancer (Y60.63) : Brother 7. Family history of Prostate Cancer (K16.01) : Brother 8. Family history of Prostate Cancer (U93.23) : Brother  64. Family history of Tuberculosis : Brother  Social History Problems  1. Denied: Alcohol Use 2. Marital History - Currently Married 3. Never A Smoker 4. Denied: Tobacco  Use  Review of Systems  Genitourinary: hematuria, but no dysuria.  Constitutional: no fever.  Cardiovascular: no leg swelling.    Vitals  Height: 6 ft 2 in Weight: 195 lb  BMI Calculated: 25.04 BSA Calculated: 2.15   Physical Exam Constitutional: Well nourished and well developed . No acute distress.  ENT:. The ears and nose are normal in appearance.  Neck: The appearance of the neck is normal and no neck mass is present.  Pulmonary: No respiratory distress, normal respiratory rhythm and effort and clear bilateral breath sounds.  Cardiovascular: Heart rate and rhythm are normal . No peripheral edema.  Abdomen: The abdomen is soft and nontender. No masses are palpated. No CVA tenderness. No hernias are palpable. No hepatosplenomegaly noted.  Lymphatics: The femoral and inguinal nodes are not enlarged or tender.  Skin: Normal skin turgor, no visible rash and no visible skin lesions.  Neuro/Psych:. Mood and affect are appropriate.    Selected Results  AU CT-STONE PROTOCOL [id number] 12:00AM Raynelle Bring   Test Name Result Flag Reference  CT-STONE PROTOCOL (Report)    ** RADIOLOGY REPORT BY [facility] **   CLINICAL DATA: Microhematuria.  EXAM: CT ABDOMEN AND PELVIS WITHOUT CONTRAST (URINARY CALCULUS PROTOCOL)  TECHNIQUE: Multidetector CT imaging was performed through the abdomen and pelvis without intravenous contrast to include the urinary tract.  COMPARISON: CT of the abdomen and pelvis 06/25/2012.  FINDINGS: Lung Bases: Mild scarring in the left lung base. Hiatal hernia incompletely visualized.  Abdomen/Pelvis: Large calculus in the right renal pelvis measuring 19 x 12 x 13 mm. There are other smaller calculi in the this in the right renal pelvis, and in the right renal collecting system. No associated hydronephrosis to suggest significant obstruction at this time. No additional calculi are noted within the collecting system of the left kidney, or  along the course of either ureter. In the lumen of the urinary bladder there are several calculi present, largest of which measure up to 13 mm in diameter. 14 mm exophytic low-attenuation lesion in the interpolar region of the left kidney is incompletely characterized, but favored to represent a small cyst and unchanged.  Numerous small calcified gallstones lying dependently within the lumen of the gallbladder. No current findings to suggest acute cholecystitis at this time. The unenhanced appearance of the visualized liver, pancreas, spleen and bilateral adrenal glands is unremarkable. Atherosclerosis throughout the abdominal and pelvic vasculature, without evidence of aneurysm. No significant volume of ascites. No pneumoperitoneum. No pathologic distention of small bowel. There is extensive colonic diverticulosis, without surrounding inflammatory changes to suggest an acute diverticulitis at this time. Postoperative changes of right inguinal herniorrhaphy are noted. Prostate gland is massively enlarged measuring up to 8.6 x 6.6 cm.  Musculoskeletal: There are no aggressive appearing lytic or blastic lesions noted in the visualized portions of the skeleton.  IMPRESSION: 1. Numerous calculi within the bladder, right renal pelvis and right renal collecting system presumably account for the microhematuria. 2. No findings to suggest urinary tract obstruction at this time. 3. Massively enlarged prostate gland. Correlation with PSA level is recommended. 4. Cholelithiasis without evidence to suggest acute cholecystitis at this time. 5. Colonic diverticulosis without findings to suggest acute diverticulitis at this time. 6. Additional incidental findings, as above.   Electronically Signed  By: Vinnie Langton M.D.  On:  06/06/2013 11:27     Assessment Assessed  1. Nephrolithiasis (592.0) 2. Bladder calculus (594.1)   Discussion/Summary 1. Right renal calculi: He has had  regrowth of his right renal calculi which is significant. His stone burden measures approximately 2 cm in largest diameter. Considering the rapid regrowth, we reviewed options including percutaneous nephrostolithotomy, ureteroscopic laser lithotripsy, and shockwave lithotripsy. Considering the fact that he does have a history of cardiac stents managed with aspirin 81 mg, we have tried to avoid procedures that would require him to stop antiplatelet therapy. He understands that I ideally would recommend a percutaneous nephrostolithotomy in this situation although would have some concern for bleeding if he continued antiplatelet therapy perioperatively. He understands that the other option which likely would result in a lower risk of bleeding would be ureteroscopic laser lithotripsy as previously performed. However, he understands that I likely would recommend staged procedures to try to render him stone free as possible. He does elect to proceed in this fashion will be scheduled for right ureteroscopic laser lithotripsy. We will then likely need to perform a second look procedure a couple of weeks after his initial procedure.    2. Bladder calculi: He also has 2 large bladder calculi likely a result of poor bladder emptying and related to his very large prostate. We discussed optimal therapy which would involve cystolitholapaxy and TURP or even open cystolithotomy and suprapubic prostatectomy. However, he again like to avoid any additional risk of possible and understands the slight increased risk of bleeding associated with antiplatelet therapy and a TURP. We did also discuss the option of laser vaporization of the prostate may be less risky on his antiplatelet therapy. Currently, he would like to avoid any procedure on his prostate. He will continue finasteride and doxazosin. He would like to proceed with cystolitholapaxy at the time of his upcoming ureteroscopy.

## 2013-07-28 ENCOUNTER — Encounter (HOSPITAL_COMMUNITY)
Admission: RE | Disposition: A | Discharge status: Home / Self Care | Payer: Self-pay | Source: Ambulatory Visit | Attending: Urology

## 2013-07-28 ENCOUNTER — Encounter (HOSPITAL_COMMUNITY): Payer: Medicare Other | Admitting: Anesthesiology

## 2013-07-28 ENCOUNTER — Ambulatory Visit (HOSPITAL_COMMUNITY)
Admission: RE | Admit: 2013-07-28 | Discharge: 2013-07-28 | Disposition: A | Discharge status: Home / Self Care | Payer: Medicare Other | Source: Ambulatory Visit | Attending: Urology | Admitting: Urology

## 2013-07-28 ENCOUNTER — Ambulatory Visit (HOSPITAL_COMMUNITY): Payer: Medicare Other

## 2013-07-28 ENCOUNTER — Encounter (HOSPITAL_COMMUNITY): Payer: Self-pay | Admitting: *Deleted

## 2013-07-28 ENCOUNTER — Ambulatory Visit (HOSPITAL_COMMUNITY): Payer: Medicare Other | Admitting: Anesthesiology

## 2013-07-28 DIAGNOSIS — N200 Calculus of kidney: Secondary | ICD-10-CM | POA: Insufficient documentation

## 2013-07-28 DIAGNOSIS — R51 Headache: Secondary | ICD-10-CM | POA: Insufficient documentation

## 2013-07-28 DIAGNOSIS — N400 Enlarged prostate without lower urinary tract symptoms: Secondary | ICD-10-CM | POA: Insufficient documentation

## 2013-07-28 DIAGNOSIS — K219 Gastro-esophageal reflux disease without esophagitis: Secondary | ICD-10-CM | POA: Insufficient documentation

## 2013-07-28 DIAGNOSIS — Z79899 Other long term (current) drug therapy: Secondary | ICD-10-CM | POA: Insufficient documentation

## 2013-07-28 DIAGNOSIS — G4730 Sleep apnea, unspecified: Secondary | ICD-10-CM | POA: Insufficient documentation

## 2013-07-28 DIAGNOSIS — I10 Essential (primary) hypertension: Secondary | ICD-10-CM | POA: Insufficient documentation

## 2013-07-28 DIAGNOSIS — Z7982 Long term (current) use of aspirin: Secondary | ICD-10-CM | POA: Insufficient documentation

## 2013-07-28 DIAGNOSIS — N289 Disorder of kidney and ureter, unspecified: Secondary | ICD-10-CM | POA: Insufficient documentation

## 2013-07-28 DIAGNOSIS — C61 Malignant neoplasm of prostate: Secondary | ICD-10-CM | POA: Insufficient documentation

## 2013-07-28 DIAGNOSIS — N210 Calculus in bladder: Secondary | ICD-10-CM | POA: Insufficient documentation

## 2013-07-28 DIAGNOSIS — I2510 Atherosclerotic heart disease of native coronary artery without angina pectoris: Secondary | ICD-10-CM | POA: Insufficient documentation

## 2013-07-28 DIAGNOSIS — E039 Hypothyroidism, unspecified: Secondary | ICD-10-CM | POA: Insufficient documentation

## 2013-07-28 HISTORY — DX: Sleep apnea, unspecified: G47.30

## 2013-07-28 HISTORY — DX: Pneumonia, unspecified organism: J18.9

## 2013-07-28 HISTORY — PX: HOLMIUM LASER APPLICATION: SHX5852

## 2013-07-28 HISTORY — DX: Hyperlipidemia, unspecified: E78.5

## 2013-07-28 HISTORY — PX: CYSTOSCOPY WITH RETROGRADE PYELOGRAM, URETEROSCOPY AND STENT PLACEMENT: SHX5789

## 2013-07-28 LAB — BASIC METABOLIC PANEL
BUN: 23 mg/dL (ref 6–23)
CO2: 21 mEq/L (ref 19–32)
CREATININE: 1.13 mg/dL (ref 0.50–1.35)
Calcium: 8.7 mg/dL (ref 8.4–10.5)
Chloride: 100 mEq/L (ref 96–112)
GFR, EST AFRICAN AMERICAN: 71 mL/min — AB (ref >90)
GFR, EST NON AFRICAN AMERICAN: 61 mL/min — AB (ref >90)
Glucose, Bld: 111 mg/dL — ABNORMAL HIGH (ref 70–99)
Potassium: 4.6 mEq/L (ref 3.7–5.3)
Sodium: 136 mEq/L — ABNORMAL LOW (ref 137–147)

## 2013-07-28 LAB — CBC
HEMATOCRIT: 40.5 % (ref 39.0–52.0)
Hemoglobin: 13.9 g/dL (ref 13.0–17.0)
MCH: 30.5 pg (ref 26.0–34.0)
MCHC: 34.3 g/dL (ref 30.0–36.0)
MCV: 88.8 fL (ref 78.0–100.0)
Platelets: 197 10*3/uL (ref 150–400)
RBC: 4.56 MIL/uL (ref 4.22–5.81)
RDW: 13.3 % (ref 11.5–15.5)
WBC: 7.3 10*3/uL (ref 4.0–10.5)

## 2013-07-28 SURGERY — CYSTOURETEROSCOPY, WITH RETROGRADE PYELOGRAM AND STENT INSERTION
Anesthesia: General | Laterality: Right

## 2013-07-28 MED ORDER — HYDROMORPHONE HCL PF 2 MG/ML IJ SOLN
INTRAMUSCULAR | Status: AC
  Filled 2013-07-28: qty 1

## 2013-07-28 MED ORDER — EPHEDRINE SULFATE 50 MG/ML IJ SOLN
INTRAMUSCULAR | Status: DC | PRN
  Administered 2013-07-28 (×4): 5 mg via INTRAVENOUS

## 2013-07-28 MED ORDER — PROMETHAZINE HCL 25 MG/ML IJ SOLN: 6.2500 mg | INTRAMUSCULAR | Status: DC | PRN

## 2013-07-28 MED ORDER — ONDANSETRON HCL 4 MG/2ML IJ SOLN
INTRAMUSCULAR | Status: AC
  Filled 2013-07-28: qty 2

## 2013-07-28 MED ORDER — STERILE WATER FOR INJECTION IJ SOLN
INTRAMUSCULAR | Status: AC
  Filled 2013-07-28: qty 10

## 2013-07-28 MED ORDER — SODIUM CHLORIDE 0.9 % IJ SOLN
INTRAMUSCULAR | Status: AC
  Filled 2013-07-28: qty 10

## 2013-07-28 MED ORDER — HYDROCODONE-ACETAMINOPHEN 5-325 MG PO TABS
1.0000 | ORAL_TABLET | Freq: Four times a day (QID) | ORAL | Status: DC | PRN
  Administered 2013-07-28 (×2): 1 via ORAL
  Filled 2013-07-28 (×2): qty 1

## 2013-07-28 MED ORDER — SODIUM CHLORIDE 0.9 % IR SOLN
Status: DC | PRN
  Administered 2013-07-28 (×3): 1000 mL via INTRAVESICAL

## 2013-07-28 MED ORDER — FENTANYL CITRATE 0.05 MG/ML IJ SOLN
INTRAMUSCULAR | Status: AC
  Filled 2013-07-28: qty 2

## 2013-07-28 MED ORDER — PROPOFOL 10 MG/ML IV BOLUS
INTRAVENOUS | Status: AC
  Filled 2013-07-28: qty 20

## 2013-07-28 MED ORDER — LACTATED RINGERS IV SOLN
INTRAVENOUS | Status: DC
  Administered 2013-07-28: 17:00:00 via INTRAVENOUS
  Administered 2013-07-28: 1000 mL via INTRAVENOUS

## 2013-07-28 MED ORDER — HYDROMORPHONE HCL PF 1 MG/ML IJ SOLN
INTRAMUSCULAR | Status: DC | PRN
  Administered 2013-07-28: .4 mg via INTRAVENOUS

## 2013-07-28 MED ORDER — ONDANSETRON HCL 4 MG/2ML IJ SOLN
INTRAMUSCULAR | Status: DC | PRN
Start: 2013-07-28 — End: 2013-07-28
  Administered 2013-07-28: 4 mg via INTRAVENOUS

## 2013-07-28 MED ORDER — EPHEDRINE SULFATE 50 MG/ML IJ SOLN
INTRAMUSCULAR | Status: AC
  Filled 2013-07-28: qty 1

## 2013-07-28 MED ORDER — LIDOCAINE HCL (CARDIAC) 20 MG/ML IV SOLN
INTRAVENOUS | Status: AC
  Filled 2013-07-28: qty 5

## 2013-07-28 MED ORDER — LIDOCAINE HCL (CARDIAC) 20 MG/ML IV SOLN
INTRAVENOUS | Status: DC | PRN
  Administered 2013-07-28: 100 mg via INTRAVENOUS

## 2013-07-28 MED ORDER — HYDROMORPHONE HCL PF 1 MG/ML IJ SOLN: 0.2500 mg | INTRAMUSCULAR | Status: DC | PRN

## 2013-07-28 MED ORDER — IOHEXOL 300 MG/ML  SOLN
INTRAMUSCULAR | Status: DC | PRN
  Administered 2013-07-28: 3 mL

## 2013-07-28 MED ORDER — MEPERIDINE HCL 50 MG/ML IJ SOLN: 6.2500 mg | INTRAMUSCULAR | Status: DC | PRN

## 2013-07-28 MED ORDER — FENTANYL CITRATE 0.05 MG/ML IJ SOLN
INTRAMUSCULAR | Status: DC | PRN
Start: 2013-07-28 — End: 2013-07-28
  Administered 2013-07-28 (×2): 25 ug via INTRAVENOUS
  Administered 2013-07-28: 50 ug via INTRAVENOUS

## 2013-07-28 MED ORDER — SULFAMETHOXAZOLE-TMP DS 800-160 MG PO TABS: 1.0000 | ORAL_TABLET | Freq: Two times a day (BID) | ORAL | Status: DC

## 2013-07-28 MED ORDER — PROPOFOL 10 MG/ML IV BOLUS
INTRAVENOUS | Status: DC | PRN
  Administered 2013-07-28: 150 mg via INTRAVENOUS

## 2013-07-28 MED ORDER — GENTAMICIN SULFATE 40 MG/ML IJ SOLN
440.0000 mg | INTRAVENOUS | Status: AC
  Administered 2013-07-28: 440 mg via INTRAVENOUS
  Filled 2013-07-28: qty 11

## 2013-07-28 MED ORDER — HYDROCODONE-ACETAMINOPHEN 5-325 MG PO TABS: 1.0000 | ORAL_TABLET | Freq: Four times a day (QID) | ORAL | Status: DC | PRN

## 2013-07-28 SURGICAL SUPPLY — 18 items
BAG URO CATCHER STRL LF (DRAPE) ×3 IMPLANT
BASKET ZERO TIP NITINOL 2.4FR (BASKET) IMPLANT
CATH INTERMIT  6FR 70CM (CATHETERS) ×3 IMPLANT
CATH URET 5FR 28IN OPEN ENDED (CATHETERS) ×3 IMPLANT
CLOTH BEACON ORANGE TIMEOUT ST (SAFETY) ×3 IMPLANT
DRAPE CAMERA CLOSED 9X96 (DRAPES) ×3 IMPLANT
EXTRACTOR STONE NITINOL NGAGE (UROLOGICAL SUPPLIES) ×3 IMPLANT
FIBER LASER FLEXIVA 200 (UROLOGICAL SUPPLIES) ×3 IMPLANT
GLOVE BIOGEL M STRL SZ7.5 (GLOVE) ×3 IMPLANT
GOWN STRL REUS W/TWL LRG LVL3 (GOWN DISPOSABLE) ×6 IMPLANT
GUIDEWIRE ANG ZIPWIRE 038X150 (WIRE) ×3 IMPLANT
GUIDEWIRE STR DUAL SENSOR (WIRE) ×3 IMPLANT
MANIFOLD NEPTUNE II (INSTRUMENTS) ×3 IMPLANT
PACK CYSTO (CUSTOM PROCEDURE TRAY) ×3 IMPLANT
STENT CONTOUR 6FRX26X.038 (STENTS) ×3 IMPLANT
SYRINGE IRR TOOMEY STRL 70CC (SYRINGE) ×6 IMPLANT
TUBING CONNECTING 10 (TUBING) ×2 IMPLANT
TUBING CONNECTING 10' (TUBING) ×1

## 2013-07-28 NOTE — Transfer of Care (Signed)
Immediate Anesthesia Transfer of Care Note  Patient: Nicolas Bush  Procedure(s) Performed: Procedure(s): CYSTOSCOPY, RIGHT RETROGRADE PYELOGRAM RIGHT URETEROSCOPY WITH LASER ABLATION AND BASKETRY OF RIGHT KIDNEY CALCULI, PLACEMENT OF RIGHT URETERAL STENT, BLADDER FULGURATION (Right) HOLMIUM LASER APPLICATION (Right)  Patient Location: PACU  Anesthesia Type:General  Level of Consciousness: sedated  Airway & Oxygen Therapy: Patient Spontanous Breathing and Patient connected to face mask oxygen  Post-op Assessment: Report given to PACU RN and Post -op Vital signs reviewed and stable  Post vital signs: Reviewed and stable  Complications: No apparent anesthesia complications

## 2013-07-28 NOTE — Anesthesia Preprocedure Evaluation (Signed)
Anesthesia Evaluation  Patient identified by MRN, date of birth, ID band Patient awake    Reviewed: Allergy & Precautions, H&P , NPO status , Patient's Chart, lab work & pertinent test results  Airway Mallampati: II TM Distance: >3 FB Neck ROM: Full    Dental no notable dental hx.    Pulmonary shortness of breath and with exertion, sleep apnea , pneumonia -,  breath sounds clear to auscultation  Pulmonary exam normal       Cardiovascular hypertension, Pt. on medications + CAD and + Cardiac Stents (2011) - dysrhythmias Rhythm:Regular Rate:Normal     Neuro/Psych  Headaches, negative psych ROS   GI/Hepatic negative GI ROS, Neg liver ROS, GERD-  Medicated and Controlled,  Endo/Other  negative endocrine ROSHypothyroidism   Renal/GU Renal disease     Musculoskeletal negative musculoskeletal ROS (+)   Abdominal   Peds  Hematology negative hematology ROS (+)   Anesthesia Other Findings Upper front cap  Reproductive/Obstetrics                           Anesthesia Physical  Anesthesia Plan  ASA: III  Anesthesia Plan: General   Post-op Pain Management:    Induction: Intravenous  Airway Management Planned: LMA  Additional Equipment:   Intra-op Plan:   Post-operative Plan: Extubation in OR  Informed Consent: I have reviewed the patients History and Physical, chart, labs and discussed the procedure including the risks, benefits and alternatives for the proposed anesthesia with the patient or authorized representative who has indicated his/her understanding and acceptance.   Dental advisory given  Plan Discussed with: CRNA  Anesthesia Plan Comments:         Anesthesia Quick Evaluation

## 2013-07-28 NOTE — Op Note (Signed)
Preoperative diagnosis: Right renal and bladder calculi  Postoperative diagnosis: Right renal and bladder calculi  Procedure:  1. Cystoscopy 2. Right ureteroscopy and stone removal 3. Ureteroscopic laser lithotripsy 4. Right ureteral stent placement (6 x 26) - no string 5. Right retrograde pyelography with interpretation 6. Cystolithalopaxy 7. Fulguration of the bladder  Surgeon: Pryor Curia. M.D.  Anesthesia: General  Complications: None  Intraoperative findings: Righ retrograde pyelography demonstrated a filling defect within the right renal pelvis consistent with the patient's known calculus without other abnormalities noted.  EBL: Minimal  Specimens: 1. Right renal calculi 2. Bladder calculi  Disposition of specimens: [facility] for stone analysis  Indication: Nicolas Bush  is a 79 y.o. patient with urolithiasis including large burden right renal calculi and bladder calculi. After reviewing the management options for treatment, they elected to proceed with the above surgical procedure(s). We have discussed the potential benefits and risks of the procedure, side effects of the proposed treatment, the likelihood of the patient achieving the goals of the procedure, and any potential problems that might occur during the procedure or recuperation. Informed consent has been obtained.  Description of procedure:  The patient was taken to the operating room and general anesthesia was induced.  The patient was placed in the dorsal lithotomy position, prepped and draped in the usual sterile fashion, and preoperative antibiotics were administered. A preoperative time-out was performed.   Cystourethroscopy was performed.  The patient's urethra was examined and was normal.  He had a very enlarged prostate with a large median lobe component. The bladder was then systematically examined in its entirety. There were multiple large bladder stones measuring up to 2.5  cm.   Attention then turned to the right ureteral orifice and a ureteral catheter was used to intubate the ureteral orifice.  Omnipaque contrast was injected through the ureteral catheter and a retrograde pyelogram was performed with findings as dictated above.  A 0.38 sensor guidewire was then advanced up the right ureter into the renal pelvis under fluoroscopic guidance. He was noted to have J hooking ureters but a glide wire was able to be manipulated through a 5 Fr ureteral catheter and then the sensor wire was exchanged.  A 12/14 Fr ureteral access sheath was then advanced over the guide wire. The digital flexible ureteroscope was then advanced through the access sheath into the ureter next to the guidewire and multiple large renal calculi were identified located in the renal pelvis. The largest measured 2 cm.    The stones were then fragmented with the 200 micron holmium laser fiber on a setting of 0.5 J and frequency of 20 Hz until adequate fragmentation was performed.  All sizable stones were then removed with an N gauge basket.  Reinspection of the ureter/renal pelvis revealed no remaining visible stones or fragments of significant size.   The safety wire was then replaced and the access sheath removed.  The guidewire was backloaded through the cystoscope and a ureteral stent was advance over the wire using Seldinger technique.  The stent was positioned appropriately under fluoroscopic and cystoscopic guidance.  The wire was then removed with an adequate stent curl noted in the renal pelvis as well as in the bladder.  Attention then turned to the bladder stones.  The 25 Fr sheath was placed and the lithotrite was used to fragment the stones.  Once fragmented, the stone were removed by irrigation through a Toomey syringe and basketing any fragments.  Once all fragments  were adequately removed, the bladder was inspected and there were a few bleeding sites.  The bugbee electrode was used to  fulgurate these areas resulting in excellent hemostasis.  The bladder was then emptied and the procedure ended.  The patient appeared to tolerate the procedure well and without complications.  The patient was able to be awakened and transferred to the recovery unit in satisfactory condition.   Pryor Curia MD

## 2013-07-28 NOTE — Discharge Instructions (Signed)
1. You may see some blood in the urine and may have some burning with urination for 48-72 hours. You also may notice that you have to urinate more frequently or urgently after your procedure which is normal.  2. You should call should you develop an inability urinate, fever > 101, persistent nausea and vomiting that prevents you from eating or drinking to stay hydrated.  3. If you have a stent, you will likely urinate more frequently and urgently until the stent is removed and you may experience some discomfort/pain in the lower abdomen and flank especially when urinating. You may take pain medication prescribed to you if needed for pain. You may also intermittently have blood in the urine until the stent is removed. 4. You should stay off Plavix until returning for your postoperative visit.  Once your stent has been removed, you may restart Plavix.

## 2013-07-28 NOTE — Anesthesia Postprocedure Evaluation (Signed)
Anesthesia Post Note  Patient: Nicolas Bush  Procedure(s) Performed: Procedure(s) (LRB): CYSTOSCOPY, RIGHT RETROGRADE PYELOGRAM RIGHT URETEROSCOPY WITH LASER ABLATION AND BASKETRY OF RIGHT KIDNEY CALCULI, PLACEMENT OF RIGHT URETERAL STENT, BLADDER FULGURATION (Right) HOLMIUM LASER APPLICATION (Right)  Anesthesia type: General  Patient location: PACU  Post pain: Pain level controlled  Post assessment: Post-op Vital signs reviewed  Last Vitals: BP 136/63  Pulse 90  Temp(Src) 36.6 C (Oral)  Resp 14  Ht 6' (1.829 m)  Wt 198 lb 4 oz (89.926 kg)  BMI 26.88 kg/m2  SpO2 94%  Post vital signs: Reviewed  Level of consciousness: sedated  Complications: No apparent anesthesia complications

## 2013-07-29 ENCOUNTER — Encounter (HOSPITAL_COMMUNITY): Payer: Self-pay | Admitting: Urology

## 2015-01-01 IMAGING — CR DG CHEST 2V
2 series · 2 of 2 positions shown · non-contrast
Comparison: CT Abdomen and Pelvis 06/06/2013 and earlier.

CLINICAL DATA: 78-year-old male preoperative study. Initial
encounter. Coronary artery disease, history of shortness of Breath.

EXAM:
CHEST  2 VIEW

[w chest pa]
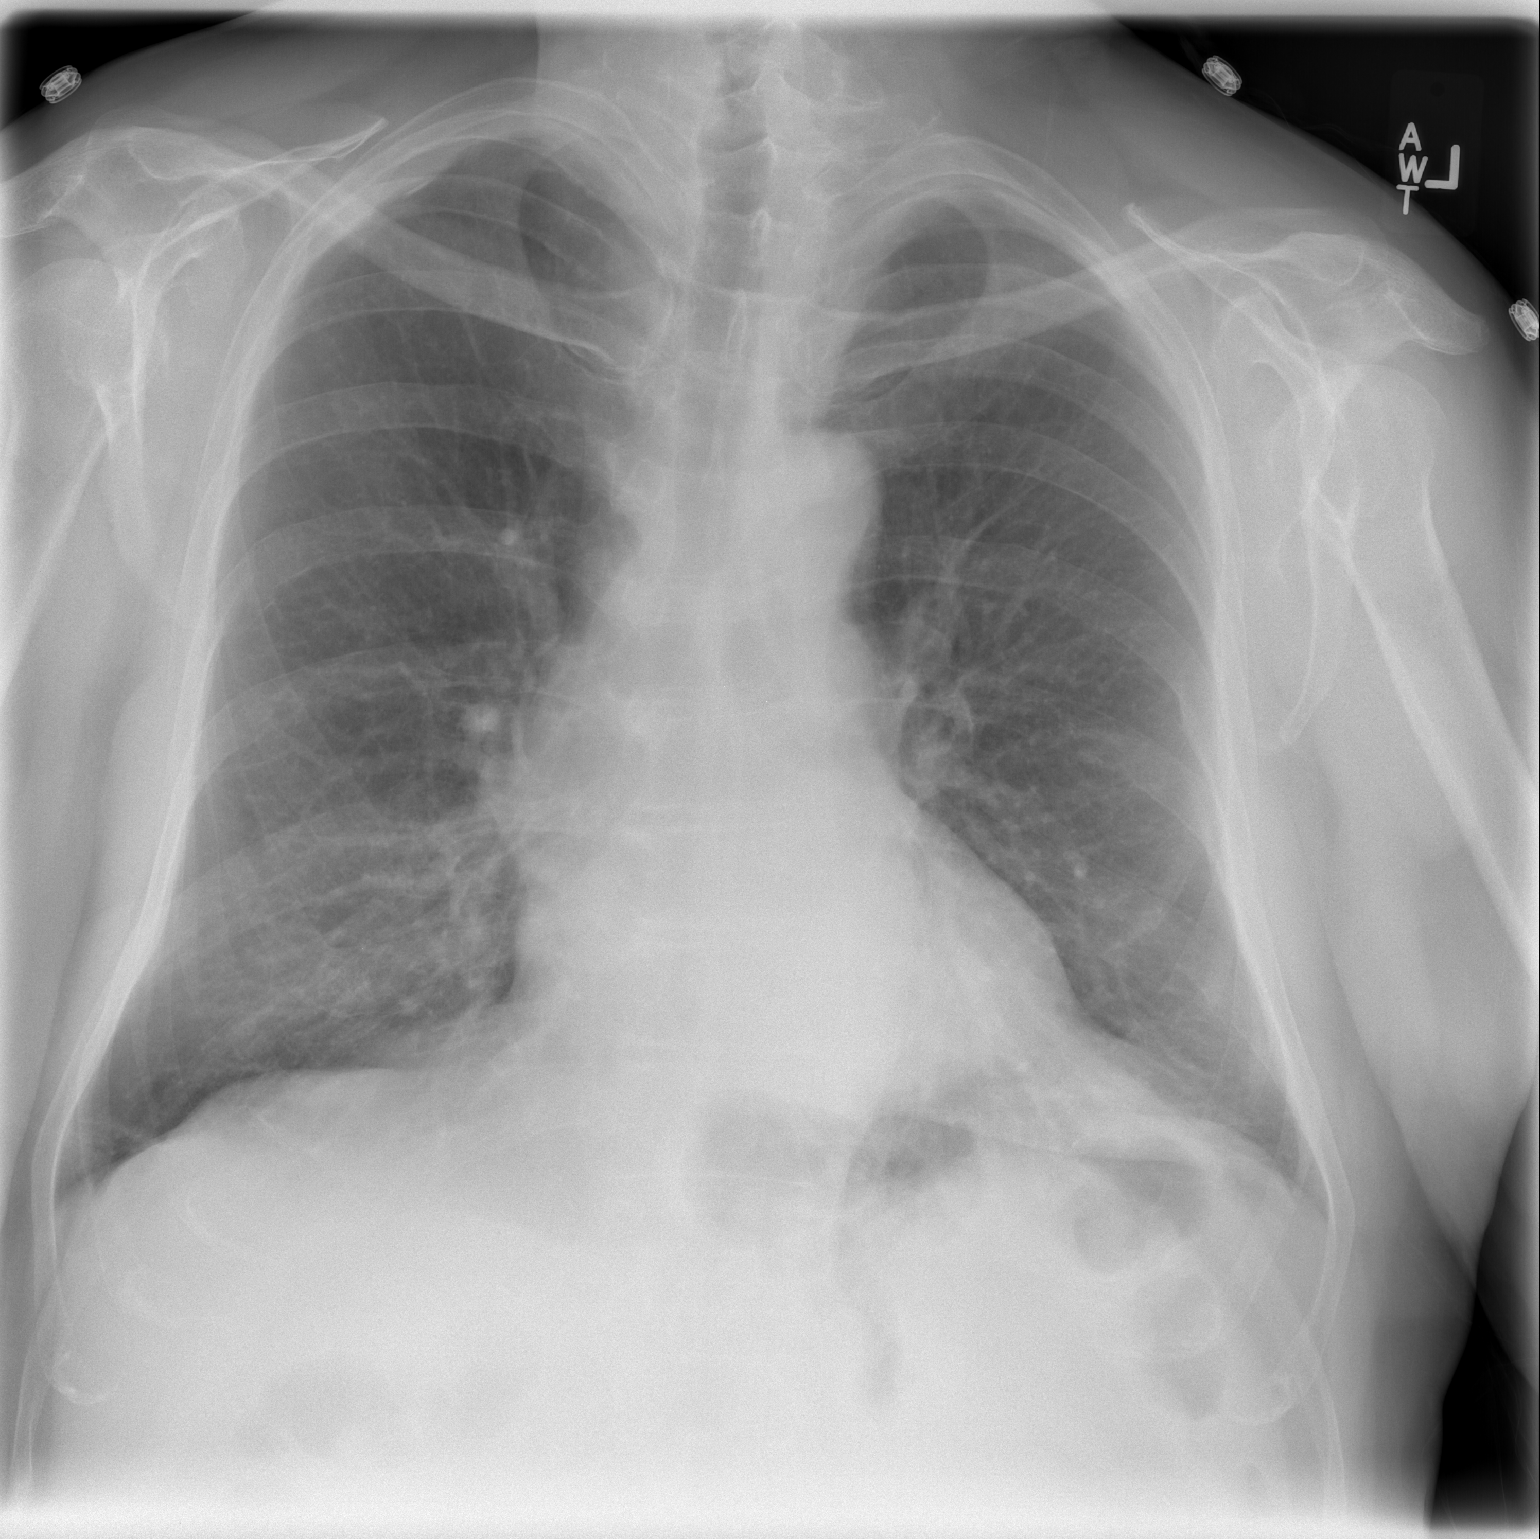

[w chest lat]
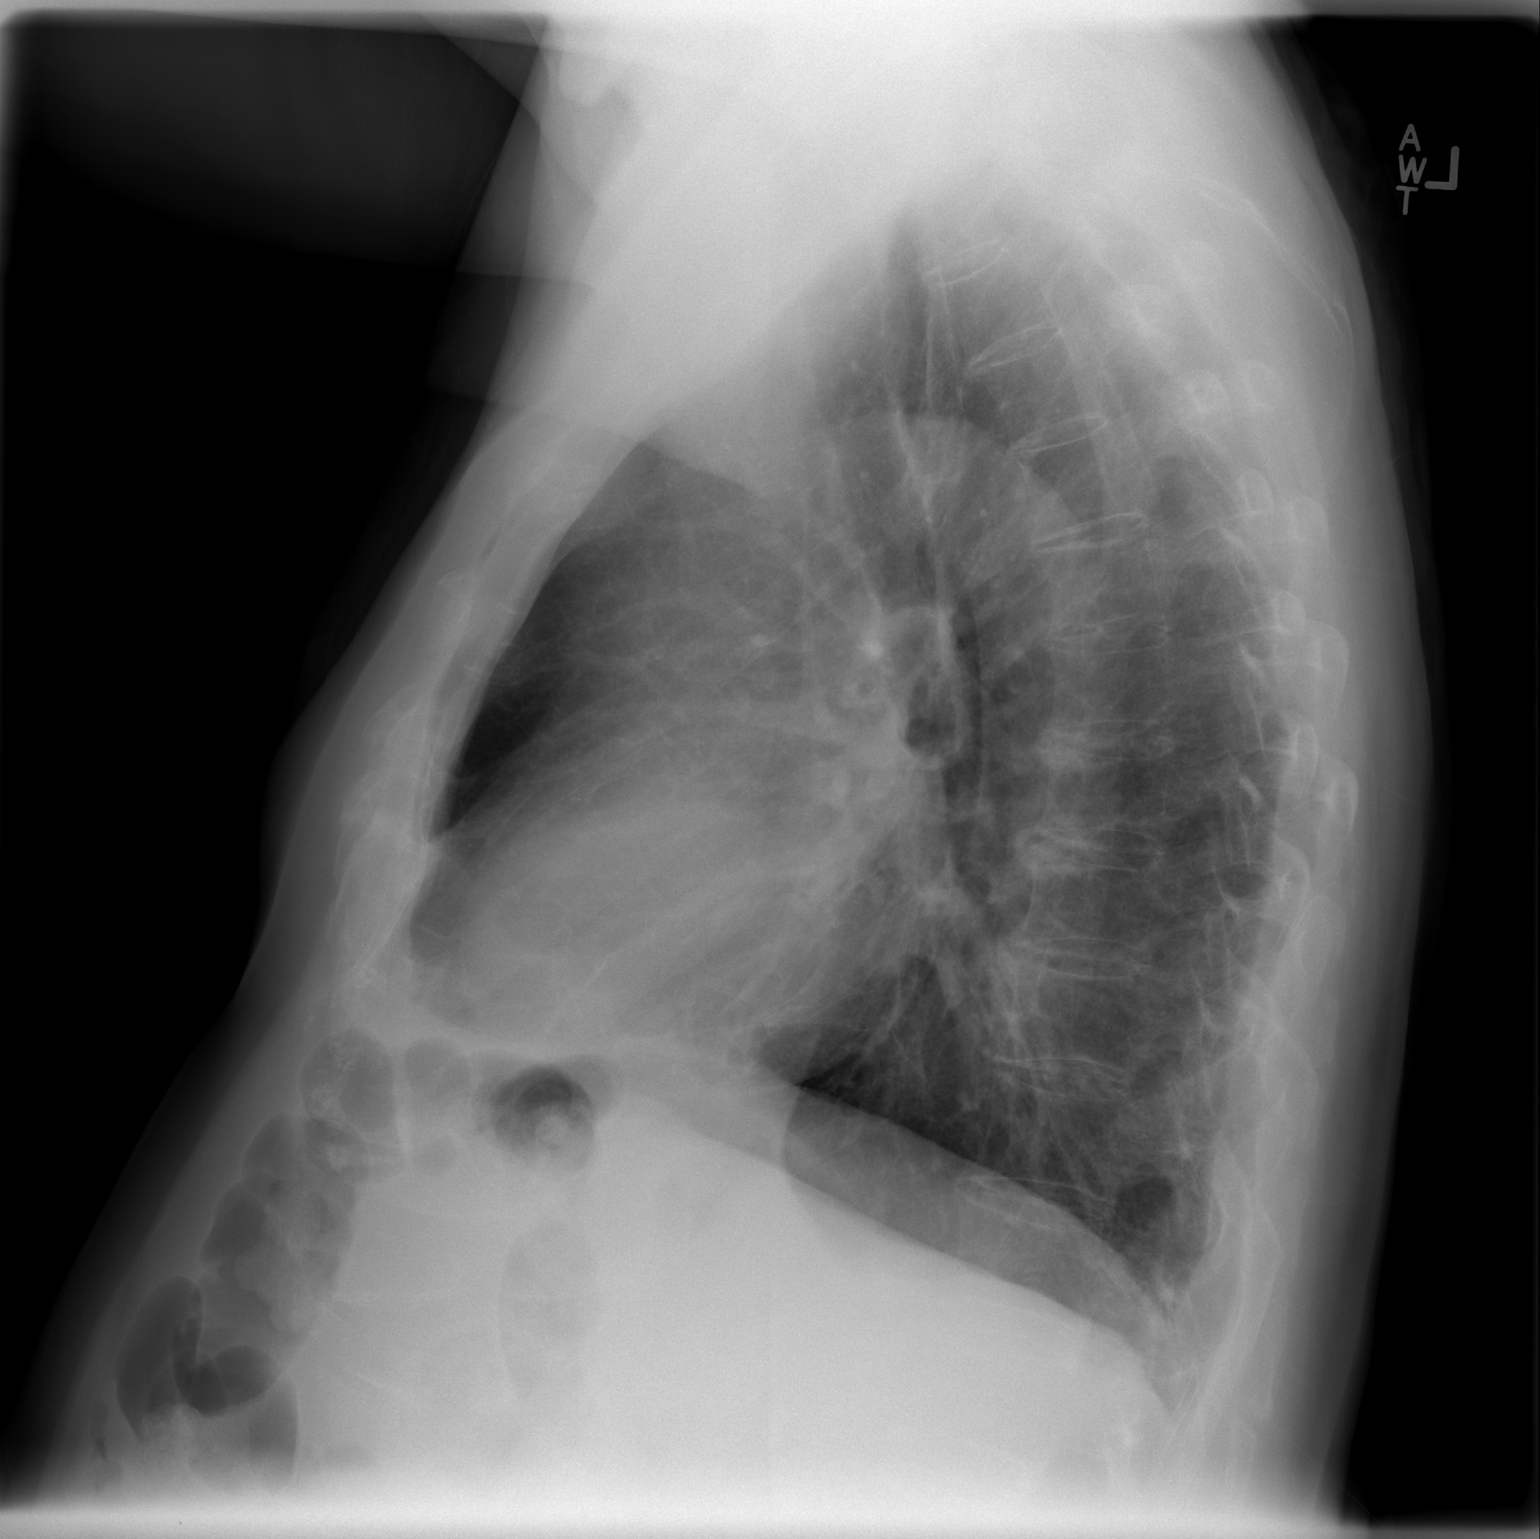

[2 of 2 positions shown; findings below may reference images not displayed]

FINDINGS: Stable lung volumes. No pneumothorax or pulmonary edema. No pleural
effusion or acute pulmonary opacity. Normal cardiac size and
mediastinal contours. Visualized tracheal air column is within
normal limits. No acute osseous abnormality identified.
IMPRESSION: No acute cardiopulmonary abnormality.

## 2017-03-27 ENCOUNTER — Other Ambulatory Visit: Payer: Self-pay | Admitting: Urology

## 2017-04-16 ENCOUNTER — Encounter (HOSPITAL_COMMUNITY): Payer: Self-pay

## 2017-04-16 ENCOUNTER — Encounter (HOSPITAL_COMMUNITY)
Admission: RE | Admit: 2017-04-16 | Discharge: 2017-04-16 | Disposition: A | Discharge status: Home / Self Care | Payer: Medicare HMO | Source: Ambulatory Visit | Attending: Urology | Admitting: Urology

## 2017-04-16 ENCOUNTER — Other Ambulatory Visit: Payer: Self-pay

## 2017-04-16 DIAGNOSIS — I440 Atrioventricular block, first degree: Secondary | ICD-10-CM | POA: Insufficient documentation

## 2017-04-16 DIAGNOSIS — N200 Calculus of kidney: Secondary | ICD-10-CM | POA: Diagnosis not present

## 2017-04-16 DIAGNOSIS — Z01810 Encounter for preprocedural cardiovascular examination: Secondary | ICD-10-CM | POA: Diagnosis present

## 2017-04-16 DIAGNOSIS — I10 Essential (primary) hypertension: Secondary | ICD-10-CM | POA: Diagnosis not present

## 2017-04-16 DIAGNOSIS — Z01812 Encounter for preprocedural laboratory examination: Secondary | ICD-10-CM | POA: Insufficient documentation

## 2017-04-16 HISTORY — DX: Personal history of urinary calculi: Z87.442

## 2017-04-16 LAB — BASIC METABOLIC PANEL
ANION GAP: 9 (ref 5–15)
BUN: 22 mg/dL — ABNORMAL HIGH (ref 6–20)
CHLORIDE: 101 mmol/L (ref 101–111)
CO2: 24 mmol/L (ref 22–32)
Calcium: 8.9 mg/dL (ref 8.9–10.3)
Creatinine, Ser: 1.03 mg/dL (ref 0.61–1.24)
GFR calc Af Amer: >60 mL/min (ref >60)
GLUCOSE: 141 mg/dL — AB (ref 65–99)
POTASSIUM: 4.4 mmol/L (ref 3.5–5.1)
Sodium: 134 mmol/L — ABNORMAL LOW (ref 135–145)

## 2017-04-16 LAB — CBC
HEMATOCRIT: 42.5 % (ref 39.0–52.0)
HEMOGLOBIN: 14.4 g/dL (ref 13.0–17.0)
MCH: 30.8 pg (ref 26.0–34.0)
MCHC: 33.9 g/dL (ref 30.0–36.0)
MCV: 91 fL (ref 78.0–100.0)
Platelets: 194 10*3/uL (ref 150–400)
RBC: 4.67 MIL/uL (ref 4.22–5.81)
RDW: 13.3 % (ref 11.5–15.5)
WBC: 7.3 10*3/uL (ref 4.0–10.5)

## 2017-04-16 NOTE — Progress Notes (Signed)
   04/16/17 1034  OBSTRUCTIVE SLEEP APNEA  Have you ever been diagnosed with sleep apnea through a sleep study? No  Do you snore loudly (loud enough to be heard through closed doors)?  1  Do you often feel tired, fatigued, or sleepy during the daytime (such as falling asleep during driving or talking to someone)? 1  Has anyone observed you stop breathing during your sleep? 0  Do you have, or are you being treated for high blood pressure? 1  BMI more than 35 kg/m2? 0  Age > 50 (1-yes) 1  Neck circumference greater than:Male 16 inches or larger, Male 17inches or larger? 0  Male Gender (Yes=1) 1  Obstructive Sleep Apnea Score 5  Score 5 or greater  Results sent to PCP

## 2017-04-16 NOTE — Patient Instructions (Addendum)
Nicolas Bush  04/16/2017   Your procedure is scheduled on: Monday 04/23/2017  Report to [facility] Main  Entrance   Report to admitting at  130 PM    Call this number if you have problems the morning of surgery [phone]    Remember: Do not eat food  :After Midnight.  MAY HAVE CLEAR LIQUIDS FROM MIDNIGHT UP UNTIL 0930 AM THEN NOTHING UNTIL AFTER SURGERY!     CLEAR LIQUID DIET   Foods Allowed                                                                     Foods Excluded  Coffee and tea, regular and decaf                             liquids that you cannot  Plain Jell-O in any flavor                                             see through such as: Fruit ices (not with fruit pulp)                                     milk, soups, orange juice  Iced Popsicles                                    All solid food Carbonated beverages, regular and diet                                    Cranberry, grape and apple juices Sports drinks like Gatorade Lightly seasoned clear broth or consume(fat free) Sugar, honey syrup  Sample Menu Breakfast                                Lunch                                     Supper Cranberry juice                    Beef broth                            Chicken broth Jell-O                                     Grape juice                           Apple juice  Coffee or tea                        Jell-O                                      Popsicle                                                Coffee or tea                        Coffee or tea  _____________________________________________________________________     Take these medicines the morning of surgery with A SIP OF WATER: Aspirin per Dr. Alinda Money patient states                                You may not have any metal on your body including hair pins and              piercings  Do not wear jewelry, make-up, lotions, powders or perfumes, deodorant   Do not wear nail polish.  Do not shave  48 hours prior to surgery.              Men may shave face and neck.   Do not bring valuables to the hospital. [place].  Contacts, dentures or bridgework may not be worn into surgery.  Leave suitcase in the car. After surgery it may be brought to your room.     Patients discharged the day of surgery will not be allowed to drive home.  Name and phone number of your driver:Granddaughter- Aldona Bar or daughter Rollene Fare                Please read over the following fact sheets you were given: _____________________________________________________________________             [facility] - Preparing for Surgery Before surgery, you can play an important role.  Because skin is not sterile, your skin needs to be as free of germs as possible.  You can reduce the number of germs on your skin by washing with CHG (chlorahexidine gluconate) soap before surgery.  CHG is an antiseptic cleaner which kills germs and bonds with the skin to continue killing germs even after washing. Please DO NOT use if you have an allergy to CHG or antibacterial soaps.  If your skin becomes reddened/irritated stop using the CHG and inform your nurse when you arrive at Short Stay. Do not shave (including legs and underarms) for at least 48 hours prior to the first CHG shower.  You may shave your face/neck. Please follow these instructions carefully:  1.  Shower with CHG Soap the night before surgery and the  morning of Surgery.  2.  If you choose to wash your hair, wash your hair first as usual with your  normal  shampoo.  3.  After you shampoo, rinse your hair and body thoroughly to remove the  shampoo.  4.  Use CHG as you would any other liquid soap.  You can apply chg directly  to the skin and wash                       Gently with a scrungie or clean washcloth.  5.  Apply the CHG Soap to your body ONLY FROM  THE NECK DOWN.   Do not use on face/ open                           Wound or open sores. Avoid contact with eyes, ears mouth and genitals (private parts).                       Wash face,  Genitals (private parts) with your normal soap.             6.  Wash thoroughly, paying special attention to the area where your surgery  will be performed.  7.  Thoroughly rinse your body with warm water from the neck down.  8.  DO NOT shower/wash with your normal soap after using and rinsing off  the CHG Soap.                9.  Pat yourself dry with a clean towel.            10.  Wear clean pajamas.            11.  Place clean sheets on your bed the night of your first shower and do not  sleep with pets. Day of Surgery : Do not apply any lotions/deodorants the morning of surgery.  Please wear clean clothes to the hospital/surgery center.  FAILURE TO FOLLOW THESE INSTRUCTIONS MAY RESULT IN THE CANCELLATION OF YOUR SURGERY PATIENT SIGNATURE_________________________________  NURSE SIGNATURE__________________________________  ________________________________________________________________________

## 2017-04-20 NOTE — H&P (Signed)
CC/HPI:   Large right UPJ calculus   He presents today for surgical treatment of his right UPJ calculus.   ALLERGIES: Codeine Derivatives    MEDICATIONS: Doxazosin Mesylate 4 mg tablet 1 tablet PO Daily  Finasteride 5 mg tablet TAKE ONE TABLET BY MOUTH DAILY AS DIRECTED  Amlodipine Besylate 5 mg tablet Oral  Aspirin Ec 81 mg tablet, delayed release Oral  Isosorbide Mononitrate Er 30 mg tablet, extended release 24 hr Oral  Metoprolol Tartrate 50 mg tablet Oral  Nitrostat 0.4 mg tablet, sublingual Sublingual  Potassium Citrate Er 10 meq (1,080 mg) tablet, extended release 2 Oral 3 times daily  Ranitidine Hcl 300 mg tablet Oral     GU PSH: Cysto Bladder Stone >2.5cm - 2015 Cysto Uretero Lithotripsy - 2015, 2014 Cystoscopy Insert Stent - 2015, 2014      PSH Notes: Cystoscopy With Ureteroscopy With Lithotripsy, Cystoscopy With Fragmentation Of Bladder Calculus Over 2.5cm, Cystoscopy With Insertion Of Ureteral Stent Right, Cystoscopy With Ureteroscopy With Lithotripsy, Cystoscopy With Insertion Of Ureteral Stent Right, Cath Stent 1 Type Drug-Eluting, Inguinal Hernia Repair   NON-GU PSH: None   GU PMH: Weak Urinary Stream - 07/05/2016 BPH w/LUTS, Benign prostatic hyperplasia (BPH) with straining on urination - 06/07/2015 Prostate Cancer, Prostate cancer - 06/07/2015 Renal calculus, Nephrolithiasis - 06/07/2015 Bladder Stone, Bladder calculus - 2015 Other microscopic hematuria, Microscopic hematuria - 2014      PMH Notes:   1) Prostate cancer: He was initially diagnosed in January 2009 and elected to be managed with active surveillance.   Initial diagnosis: February 28, 2007  TNM stage: cT1c Nx Mx  Gleason score: 3+3=6  PSA at diagnosis: 18.10 Feb 2007: Initial biopsy- 1/14 cores involved -- 1 core on left side , < 1% of overall tissue, volume 129 cc  May 2009: Biopsy - 22 cores, no malignancy, volume 171.7 cc  June 2010: Biopsy - 20 cores, no malignancy, volume 215.29 cc  Oct  2012: Biopsy - 12 cores, no malignancy, HGPIN, volume 221.0 cc   2) BPH/LUTS: His prostate volume has been estimated to be > 200 cc. Baseline symptoms include frequency, urgency, urge incontinence, incomplete emptying, intermittency, and a weak stream. Baseline IPSS: 18/3   Current treatment: Finasteride (began 5 alpha reductase therapy in 2008), Doxazosin 4 mg   3) Erectile dysfunction: He does have significant erectile dysfunction. This is not of concern to the patient.   4) Nephrolithiasis: He developed symptomatic right renal calculi in May 2014. He had recurrent large burden stone disease in the right kidney in May 2015. 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   Current medical management: Fluid hydration, increased dietary citrate   Jun 2014: R ureteroscopic laser lithotripsy  Jun 2015: R ureteroscopic laser lithotripsy  Nov 2015: 24 hr urine - low urine volume, hypocitraturia   5) Hematuria: He was noted to have persistent microscopic hematuria in May 2014. He has denied gross hematuria. He has no history of smoking. There is no family history of bladder or kidney cancer. He was found to have renal calculi as the most likely cause for his hematuria.   May 2014: Cystoscopy (negative except for BPH), CT imaging (renal calculi)   6) Bladder calculi:   Jun 2015: Cystolithalopaxy    Medical comorbidities: He had 3 cardiac stents placed in 2011 and is managed with aspirin.     NON-GU PMH: GERD Hypothyroidism    FAMILY HISTORY:  Colon Cancer - Sister Heart Disease - Father Lung Cancer - Mother nephrolithiasis - Brother ovarian cancer - Daughter Prostate Cancer - Brother, Brother, Brother Tuberculosis - Brother   SOCIAL HISTORY: Marital Status: Married Preferred Language: English; Ethnicity: Not Hispanic Or Latino; Race: White Current Smoking Status: Patient has never smoked.  Does drink.  Drinks 1 caffeinated drink per  day.     Notes: Never A Smoker, Tobacco Use, Marital History - Currently Married, Alcohol Use   REVIEW OF SYSTEMS:    GU Review Male:   Patient reports leakage of urine. Patient denies frequent urination, hard to postpone urination, burning/ pain with urination, get up at night to urinate, stream starts and stops, trouble starting your streams, and have to strain to urinate .  Gastrointestinal (Lower):   Patient denies diarrhea and constipation.  Gastrointestinal (Upper):   Patient denies nausea and vomiting.  Constitutional:   Patient denies fever, night sweats, weight loss, and fatigue.  Skin:   Patient denies skin rash/ lesion and itching.  Eyes:   Patient denies blurred vision and double vision.  Ears/ Nose/ Throat:   Patient reports sinus problems. Patient denies sore throat.  Hematologic/Lymphatic:   Patient reports easy bruising. Patient denies swollen glands.  Cardiovascular:   Patient denies leg swelling and chest pains.  Respiratory:   Patient reports cough and shortness of breath.   Endocrine:   Patient denies excessive thirst.  Musculoskeletal:   Patient reports back pain and joint pain.   Neurological:   Patient reports headaches. Patient denies dizziness.  Psychologic:   Patient denies depression and anxiety.   VITAL SIGNS:     Weight 197 lb / 89.36 kg  Height 72 in / 182.88 cm  BMI 26.7 kg/m   GU PHYSICAL EXAMINATION:    Urethral Meatus: Normal size. No lesion, no wart, no discharge, no polyp. Normal location.  Prostate: Prostate about 60 grams. Left lobe normal consistency, right lobe normal consistency. Symmetrical lobes. No prostate nodule. Left lobe no tenderness, right lobe no tenderness.    MULTI-SYSTEM PHYSICAL EXAMINATION:    Constitutional: Well-nourished. No physical deformities. Normally developed. Good grooming.  Gastrointestinal: No CVA tenderness.                 Urinalysis w/Scope Dipstick Dipstick Cont'd Micro  Color: Yellow Bilirubin: Neg  WBC/hpf: 0 - 5/hpf  Appearance: Clear Ketones: Neg RBC/hpf: 20 - 40/hpf  Specific Gravity: 1.025 Blood: 3+ Bacteria: NS (Not Seen)  pH: 6.0 Protein: 1+ Cystals: NS (Not Seen)  Glucose: Neg Urobilinogen: 0.2 Casts: Hyaline    Nitrites: Neg Trichomonas: Not Present    Leukocyte Esterase: Neg Mucous: Present      Epithelial Cells: 0 - 5/hpf      Yeast: NS (Not Seen)      Sperm: Not Present      PLAN:      Large right UPJ calculus:  His subsequent CT scan did demonstrate a large right UPJ stone.  He has now become symptomatic and we reviewed options.  Considering his anti-platelet agent that he takes for his history of cardiac stents, we discussed options and he ultimately does wish to proceed with right ureteroscopic laser lithotripsy so that he can continue his aspirin perioperatively.  We have reviewed the potential risks, complications, and the expected recovery process.  He gives informed consent

## 2017-04-23 ENCOUNTER — Encounter (HOSPITAL_COMMUNITY)
Admission: RE | Disposition: A | Discharge status: Home / Self Care | Payer: Self-pay | Source: Ambulatory Visit | Attending: Urology

## 2017-04-23 ENCOUNTER — Telehealth (HOSPITAL_COMMUNITY): Payer: Self-pay | Admitting: *Deleted

## 2017-04-23 ENCOUNTER — Ambulatory Visit (HOSPITAL_COMMUNITY): Payer: Medicare HMO

## 2017-04-23 ENCOUNTER — Ambulatory Visit (HOSPITAL_COMMUNITY): Payer: Medicare HMO | Admitting: Certified Registered Nurse Anesthetist

## 2017-04-23 ENCOUNTER — Ambulatory Visit (HOSPITAL_COMMUNITY)
Admission: RE | Admit: 2017-04-23 | Discharge: 2017-04-23 | Disposition: A | Discharge status: Home / Self Care | Payer: Medicare HMO | Source: Ambulatory Visit | Attending: Urology | Admitting: Urology

## 2017-04-23 ENCOUNTER — Other Ambulatory Visit: Payer: Self-pay

## 2017-04-23 DIAGNOSIS — Z8546 Personal history of malignant neoplasm of prostate: Secondary | ICD-10-CM | POA: Insufficient documentation

## 2017-04-23 DIAGNOSIS — I2510 Atherosclerotic heart disease of native coronary artery without angina pectoris: Secondary | ICD-10-CM | POA: Diagnosis not present

## 2017-04-23 DIAGNOSIS — G4730 Sleep apnea, unspecified: Secondary | ICD-10-CM | POA: Diagnosis not present

## 2017-04-23 DIAGNOSIS — R3912 Poor urinary stream: Secondary | ICD-10-CM | POA: Diagnosis not present

## 2017-04-23 DIAGNOSIS — N401 Enlarged prostate with lower urinary tract symptoms: Secondary | ICD-10-CM | POA: Diagnosis not present

## 2017-04-23 DIAGNOSIS — N3941 Urge incontinence: Secondary | ICD-10-CM | POA: Insufficient documentation

## 2017-04-23 DIAGNOSIS — N202 Calculus of kidney with calculus of ureter: Secondary | ICD-10-CM | POA: Diagnosis present

## 2017-04-23 DIAGNOSIS — Z419 Encounter for procedure for purposes other than remedying health state, unspecified: Secondary | ICD-10-CM

## 2017-04-23 DIAGNOSIS — Z79899 Other long term (current) drug therapy: Secondary | ICD-10-CM | POA: Insufficient documentation

## 2017-04-23 DIAGNOSIS — I10 Essential (primary) hypertension: Secondary | ICD-10-CM | POA: Insufficient documentation

## 2017-04-23 DIAGNOSIS — E039 Hypothyroidism, unspecified: Secondary | ICD-10-CM | POA: Diagnosis not present

## 2017-04-23 DIAGNOSIS — Z7982 Long term (current) use of aspirin: Secondary | ICD-10-CM | POA: Diagnosis not present

## 2017-04-23 DIAGNOSIS — R350 Frequency of micturition: Secondary | ICD-10-CM | POA: Insufficient documentation

## 2017-04-23 DIAGNOSIS — R3914 Feeling of incomplete bladder emptying: Secondary | ICD-10-CM | POA: Diagnosis not present

## 2017-04-23 DIAGNOSIS — N529 Male erectile dysfunction, unspecified: Secondary | ICD-10-CM | POA: Insufficient documentation

## 2017-04-23 DIAGNOSIS — K219 Gastro-esophageal reflux disease without esophagitis: Secondary | ICD-10-CM | POA: Diagnosis not present

## 2017-04-23 DIAGNOSIS — M1990 Unspecified osteoarthritis, unspecified site: Secondary | ICD-10-CM | POA: Insufficient documentation

## 2017-04-23 DIAGNOSIS — Z885 Allergy status to narcotic agent status: Secondary | ICD-10-CM | POA: Insufficient documentation

## 2017-04-23 HISTORY — PX: CYSTOSCOPY/URETEROSCOPY/HOLMIUM LASER/STENT PLACEMENT: SHX6546

## 2017-04-23 SURGERY — CYSTOSCOPY/URETEROSCOPY/HOLMIUM LASER/STENT PLACEMENT
Anesthesia: General | Laterality: Right

## 2017-04-23 MED ORDER — ONDANSETRON HCL 4 MG/2ML IJ SOLN
INTRAMUSCULAR | Status: DC | PRN
  Administered 2017-04-23: 4 mg via INTRAVENOUS

## 2017-04-23 MED ORDER — LIDOCAINE HCL (CARDIAC) 20 MG/ML IV SOLN
INTRAVENOUS | Status: DC | PRN
  Administered 2017-04-23: 50 mg via INTRAVENOUS

## 2017-04-23 MED ORDER — FENTANYL CITRATE (PF) 100 MCG/2ML IJ SOLN
INTRAMUSCULAR | Status: DC | PRN
  Administered 2017-04-23 (×4): 25 ug via INTRAVENOUS

## 2017-04-23 MED ORDER — CIPROFLOXACIN IN D5W 400 MG/200ML IV SOLN
INTRAVENOUS | Status: AC
  Filled 2017-04-23: qty 200

## 2017-04-23 MED ORDER — INDIGOTINDISULFONATE SODIUM 8 MG/ML IJ SOLN
INTRAMUSCULAR | Status: DC | PRN
  Administered 2017-04-23: 5 mL via INTRAVENOUS

## 2017-04-23 MED ORDER — IOHEXOL 300 MG/ML  SOLN
INTRAMUSCULAR | Status: DC | PRN
  Administered 2017-04-23: 10 mL

## 2017-04-23 MED ORDER — SODIUM CHLORIDE 0.9 % IR SOLN
Status: DC | PRN
  Administered 2017-04-23: 6000 mL

## 2017-04-23 MED ORDER — PROPOFOL 10 MG/ML IV BOLUS
INTRAVENOUS | Status: AC
  Filled 2017-04-23: qty 20

## 2017-04-23 MED ORDER — DEXAMETHASONE SODIUM PHOSPHATE 10 MG/ML IJ SOLN
INTRAMUSCULAR | Status: DC | PRN
  Administered 2017-04-23: 10 mg via INTRAVENOUS

## 2017-04-23 MED ORDER — CIPROFLOXACIN IN D5W 400 MG/200ML IV SOLN
400.0000 mg | Freq: Once | INTRAVENOUS | Status: AC
  Administered 2017-04-23: 400 mg via INTRAVENOUS

## 2017-04-23 MED ORDER — LACTATED RINGERS IV SOLN
INTRAVENOUS | Status: DC
  Administered 2017-04-23: 14:00:00 via INTRAVENOUS

## 2017-04-23 MED ORDER — FENTANYL CITRATE (PF) 100 MCG/2ML IJ SOLN: 25.0000 ug | INTRAMUSCULAR | Status: DC | PRN

## 2017-04-23 MED ORDER — ONDANSETRON HCL 4 MG/2ML IJ SOLN
INTRAMUSCULAR | Status: AC
  Filled 2017-04-23: qty 2

## 2017-04-23 MED ORDER — DEXAMETHASONE SODIUM PHOSPHATE 10 MG/ML IJ SOLN
INTRAMUSCULAR | Status: AC
  Filled 2017-04-23: qty 1

## 2017-04-23 MED ORDER — FENTANYL CITRATE (PF) 100 MCG/2ML IJ SOLN
INTRAMUSCULAR | Status: AC
  Filled 2017-04-23: qty 2

## 2017-04-23 MED ORDER — PROPOFOL 10 MG/ML IV BOLUS
INTRAVENOUS | Status: DC | PRN
  Administered 2017-04-23: 120 mg via INTRAVENOUS

## 2017-04-23 MED ORDER — LIDOCAINE 2% (20 MG/ML) 5 ML SYRINGE
INTRAMUSCULAR | Status: AC
  Filled 2017-04-23: qty 5

## 2017-04-23 MED ORDER — SULFAMETHOXAZOLE-TRIMETHOPRIM 800-160 MG PO TABS: 1.0000 | ORAL_TABLET | Freq: Two times a day (BID) | ORAL | 0 refills | Status: AC

## 2017-04-23 MED ORDER — TRAMADOL HCL 50 MG PO TABS: 50.0000 mg | ORAL_TABLET | Freq: Four times a day (QID) | ORAL | 0 refills | Status: AC | PRN

## 2017-04-23 MED ORDER — INDIGOTINDISULFONATE SODIUM 8 MG/ML IJ SOLN
INTRAMUSCULAR | Status: AC
Start: 2017-04-23 — End: ?
  Filled 2017-04-23: qty 5

## 2017-04-23 MED ORDER — EPHEDRINE SULFATE 50 MG/ML IJ SOLN
INTRAMUSCULAR | Status: DC | PRN
  Administered 2017-04-23: 5 mg via INTRAVENOUS

## 2017-04-23 SURGICAL SUPPLY — 21 items
BAG URO CATCHER STRL LF (MISCELLANEOUS) ×3 IMPLANT
BASKET ZERO TIP NITINOL 2.4FR (BASKET) IMPLANT
CATH INTERMIT  6FR 70CM (CATHETERS) ×3 IMPLANT
CATH URET 5FR 28IN OPEN ENDED (CATHETERS) ×3 IMPLANT
CLOTH BEACON ORANGE TIMEOUT ST (SAFETY) ×3 IMPLANT
FIBER LASER FLEXIVA 365 (UROLOGICAL SUPPLIES) IMPLANT
FIBER LASER TRAC TIP (UROLOGICAL SUPPLIES) ×3 IMPLANT
GLOVE BIOGEL M STRL SZ7.5 (GLOVE) ×3 IMPLANT
GOWN STRL REUS W/TWL LRG LVL3 (GOWN DISPOSABLE) ×6 IMPLANT
GUIDEWIRE ANG ZIPWIRE 038X150 (WIRE) ×3 IMPLANT
GUIDEWIRE STR DUAL SENSOR (WIRE) ×3 IMPLANT
IV NS 1000ML (IV SOLUTION)
IV NS 1000ML BAXH (IV SOLUTION) IMPLANT
MANIFOLD NEPTUNE II (INSTRUMENTS) ×3 IMPLANT
PACK CYSTO (CUSTOM PROCEDURE TRAY) ×3 IMPLANT
SHEATH URETERAL 12FRX35CM (MISCELLANEOUS) ×3 IMPLANT
STENT URET 6FRX26 CONTOUR (STENTS) ×3 IMPLANT
SYRINGE IRR TOOMEY STRL 70CC (SYRINGE) ×3 IMPLANT
TUBING CONNECTING 10 (TUBING) ×2 IMPLANT
TUBING CONNECTING 10' (TUBING) ×1
TUBING UROLOGY SET (TUBING) ×3 IMPLANT

## 2017-04-23 NOTE — Op Note (Signed)
Preoperative diagnosis: Right renal pelvic calculus  Postoperative diagnosis: Right renal pelvic calculus  Procedures: 1.  Cystoscopy 2.  Right retrograde pyelography 3.  Right ureteroscopy with laser lithotripsy 4.  Right ureteral stent (6 x 26 -no string)  Surgeon: Pryor Curia. MD  Anesthesia: General  Complications: None  Intraoperative findings: Right retrograde pyelography was performed with a 5 French ureteral catheter and Omnipaque contrast.  This demonstrated severe J hooking of the right ureter consistent with his history of BPH.  No ureteral filling defects were identified although there was some dilation of the right ureter.  No filling defects were noted within the ureter although there was a large filling defect in the renal collecting system consistent with his known calculus.  Indication: This is an 83 year old gentleman with a symptomatic large 1.5 cm right renal pelvic calculus.  After reviewing options and considering his medical comorbidities and need to continue antiplatelet therapy, he elected to proceed with the above procedures.  The potential risks, complications, and alternative options were discussed in detail.  Informed consent was obtained.  Description of procedure: The patient was taken to the operating room and a general anesthetic was administered.  He was given preoperative antibiotics, placed in the dorsolithotomy position, and prepped and draped in the usual sterile fashion.  A preoperative timeout was performed.  Cystourethroscopy was then performed.  This revealed a normal anterior urethra.  The patient was noted to have an extremely large prostate with coapting lateral lobes and a very large intravesical median lobe.  This bled easily upon passage of the cystoscope.  Inspection of the bladder revealed no bladder tumors, stones, or other significant mucosal pathology.  However, his ureteral orifices were not readily identifiable.  Indigo carmine was  administered.  The right  ureteral orifice was then able to be identified and a 0.38 Sensor guidewire was advanced into the wire but met resistance distally.  I then advanced a 6 [place] ureteral catheter into the distal ureter although this was not able to be passed up the ureter past the J hooking portion of the ureter.  I then placed a Glidewire and this was able to be manipulated up into the renal collecting system.  I then removed the 6 French ureteral catheter and placed a 5 French ureteral catheter that was able to be advanced up the ureter.  Omnipaque contrast was injected in a retrograde pyelogram was performed with findings as dictated above.  I then removed the Glidewire and replaced this with the sensor wire.  A 12/14 ureteral access sheath was then advanced over the wire into the proximal ureter.  Using the digital flexible ureteroscope, this was advanced up into the renal collecting system and the patient's stone was identified.  I then fragmented the stone with the holmium laser using a 200 m fiber on a setting of 0.5 J and 20 Hz.  This resulted in dusting of the large stone.  After approximately 40 minutes of laser lithotripsy, the stone had been adequately fragmented into dust and small fragments.  No large fragments were remaining.  The sensor guidewire was then advanced through the ureteroscope and positioned in the renal collecting system.  The scope and the access sheath were then withdrawn.  The wire was then backloaded on the cystoscope and a 6 x 26 double-J ureteral stent was advanced over the wire using Seldinger technique.  It was positioned appropriately under fluoroscopic and cystoscopic guidance.  A good curl was noted in the renal pelvis  as well as in the bladder after the wire was withdrawn.  There were some clots within the bladder which were then evacuated.  There did not appear to be any active bleeding and it was not felt that a urethral catheter was necessary.  The procedure was  therefore ended.  He tolerated the procedure well without complications.  He was able to be awakened and transferred to the recovery unit in satisfactory condition.

## 2017-04-23 NOTE — Anesthesia Postprocedure Evaluation (Signed)
Anesthesia Post Note  Patient: Nicolas Bush  Procedure(s) Performed: CYSTOSCOPY/RETROGRADE/URETEROSCOPY/HOLMIUM LASER/STENT PLACEMENT (Right )     Patient location during evaluation: PACU Anesthesia Type: General Level of consciousness: awake Pain management: pain level controlled Vital Signs Assessment: post-procedure vital signs reviewed and stable Respiratory status: spontaneous breathing Cardiovascular status: stable Anesthetic complications: no    Last Vitals:  Vitals:   04/23/17 1715 04/23/17 1735  BP: (!) 142/90 (!) 161/79  Pulse: 81 85  Resp: 14 12  Temp: 37.1 C (!) 36.3 C  SpO2: 94% 94%    Last Pain:  Vitals:   04/23/17 1735  TempSrc: Axillary  PainSc: 5                  Estelle Skibicki

## 2017-04-23 NOTE — Anesthesia Procedure Notes (Signed)
Procedure Name: LMA Insertion Date/Time: 04/23/2017 2:32 PM Performed by: Glory Buff, CRNA Pre-anesthesia Checklist: Patient identified, Emergency Drugs available, Suction available and Patient being monitored Patient Re-evaluated:Patient Re-evaluated prior to induction Oxygen Delivery Method: Circle system utilized Preoxygenation: Pre-oxygenation with 100% oxygen Induction Type: IV induction LMA: LMA inserted LMA Size: 4.0 Number of attempts: 2 (Unable to get #5 LMA to seat, #4 LMA inserted and seated with ease.) Placement Confirmation: positive ETCO2 Tube secured with: Tape Dental Injury: Teeth and Oropharynx as per pre-operative assessment

## 2017-04-23 NOTE — Discharge Instructions (Addendum)

## 2017-04-23 NOTE — Transfer of Care (Signed)
Immediate Anesthesia Transfer of Care Note  Patient: Nicolas Bush  Procedure(s) Performed: CYSTOSCOPY/RETROGRADE/URETEROSCOPY/HOLMIUM LASER/STENT PLACEMENT (Right )  Patient Location: PACU  Anesthesia Type:General  Level of Consciousness: awake, alert , oriented and patient cooperative  Airway & Oxygen Therapy: Patient Spontanous Breathing and Patient connected to face mask oxygen  Post-op Assessment: Report given to RN and Post -op Vital signs reviewed and stable  Post vital signs: Reviewed and stable  Last Vitals:  Vitals:   04/23/17 1315  BP: 118/69  Pulse: 79  Resp: 16  Temp: 36.5 C  SpO2: 96%    Last Pain:  Vitals:   04/23/17 1315  TempSrc: Oral      Patients Stated Pain Goal: 4 (75/10/25 8527)  Complications: No apparent anesthesia complications

## 2017-04-23 NOTE — Anesthesia Preprocedure Evaluation (Addendum)
Anesthesia Evaluation  Patient identified by MRN, date of birth, ID band Patient awake    Airway Mallampati: II  TM Distance: >3 FB     Dental   Pulmonary shortness of breath, sleep apnea , pneumonia,    breath sounds clear to auscultation       Cardiovascular hypertension, + CAD  + dysrhythmias  Rhythm:Regular Rate:Normal     Neuro/Psych  Headaches,    GI/Hepatic GERD  ,  Endo/Other    Renal/GU Renal disease     Musculoskeletal  (+) Arthritis ,   Abdominal   Peds  Hematology   Anesthesia Other Findings   Reproductive/Obstetrics                             Anesthesia Physical Anesthesia Plan  ASA: III  Anesthesia Plan: General   Post-op Pain Management:    Induction:   PONV Risk Score and Plan: 1 and Treatment may vary due to age or medical condition, Ondansetron, Dexamethasone and Midazolam  Airway Management Planned: LMA  Additional Equipment:   Intra-op Plan:   Post-operative Plan: Extubation in OR  Informed Consent: I have reviewed the patients History and Physical, chart, labs and discussed the procedure including the risks, benefits and alternatives for the proposed anesthesia with the patient or authorized representative who has indicated his/her understanding and acceptance.   Dental advisory given  Plan Discussed with: CRNA and Anesthesiologist  Anesthesia Plan Comments:         Anesthesia Quick Evaluation

## 2017-04-24 ENCOUNTER — Encounter (HOSPITAL_COMMUNITY): Payer: Self-pay | Admitting: Urology

## 2018-09-27 IMAGING — RF DG RETROGRADE PYELOGRAM
1 series · 1 of 1 positions shown · non-contrast
Comparison: CT of the abdomen and pelvis 03/01/2017

CLINICAL DATA: 82-year-old male undergoing right laser lithotripsy
and ureteral stent placement

EXAM:
RETROGRADE PYELOGRAM

[Series 1: run · 1 of 1 slices shown]
[im 1/1]
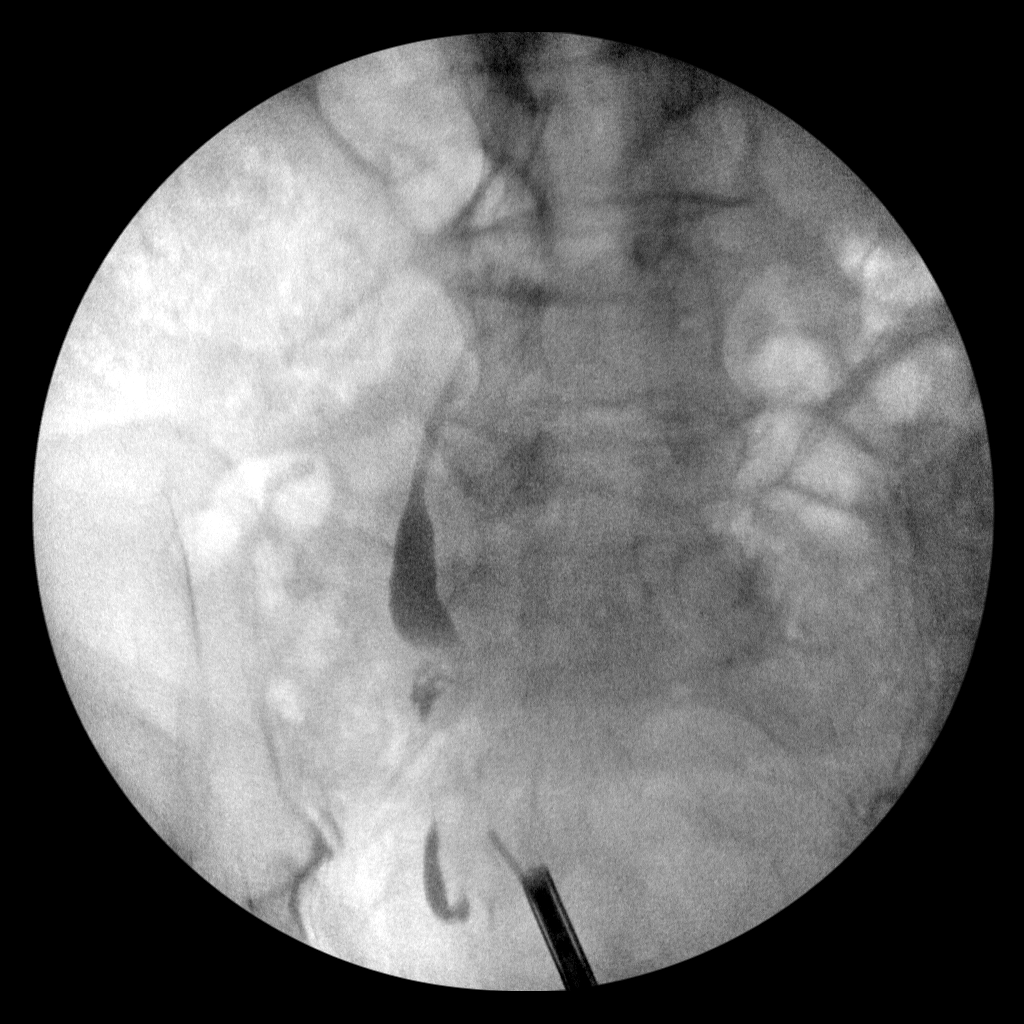

[1 of 1 positions shown; findings below may reference images not displayed]

FINDINGS: Two intraoperative spot images are obtained. The images demonstrate
cannulation the right ureteral orifice and partial retrograde
ureteropyelogram. There is some irregularity in the distal ureter at
the level of the sacral ala which has a nonspecific appearance.
Suggestion of small filling defects. These may represent stone
debris.
IMPRESSION: Limited imaging from right-sided retrograde ureteropyelogram, laser
lithotripsy and reported ureteral stent placement. Please see
operative note for further detail.
